# Patient Record
Sex: Female | Born: 1975 | Race: Black or African American | Hispanic: Yes | Marital: Married | Smoking: Never smoker
Health system: Southern US, Community
[De-identification: ages and names within clinical notes are randomized; demographics above are authoritative.]

## PROBLEM LIST (undated history)

## (undated) DIAGNOSIS — G43909 Migraine, unspecified, not intractable, without status migrainosus: Secondary | ICD-10-CM

## (undated) DIAGNOSIS — T7840XA Allergy, unspecified, initial encounter: Secondary | ICD-10-CM

## (undated) HISTORY — PX: NO PAST SURGERIES: SHX2092

## (undated) HISTORY — DX: Allergy, unspecified, initial encounter: T78.40XA

## (undated) HISTORY — DX: Migraine, unspecified, not intractable, without status migrainosus: G43.909

---

## 2020-05-15 ENCOUNTER — Ambulatory Visit (INDEPENDENT_AMBULATORY_CARE_PROVIDER_SITE_OTHER): Payer: BC Managed Care – PPO

## 2020-05-15 ENCOUNTER — Encounter: Payer: Self-pay | Admitting: Medical-Surgical

## 2020-05-15 ENCOUNTER — Ambulatory Visit (INDEPENDENT_AMBULATORY_CARE_PROVIDER_SITE_OTHER): Payer: BC Managed Care – PPO | Admitting: Medical-Surgical

## 2020-05-15 ENCOUNTER — Other Ambulatory Visit: Payer: Self-pay

## 2020-05-15 VITALS — BP 104/73 | HR 69 | Temp 98.0°F | Ht 63.0 in | Wt 231.1 lb

## 2020-05-15 DIAGNOSIS — M25551 Pain in right hip: Secondary | ICD-10-CM

## 2020-05-15 DIAGNOSIS — Z7689 Persons encountering health services in other specified circumstances: Secondary | ICD-10-CM

## 2020-05-15 DIAGNOSIS — Z114 Encounter for screening for human immunodeficiency virus [HIV]: Secondary | ICD-10-CM

## 2020-05-15 DIAGNOSIS — G4700 Insomnia, unspecified: Secondary | ICD-10-CM | POA: Diagnosis not present

## 2020-05-15 DIAGNOSIS — R6889 Other general symptoms and signs: Secondary | ICD-10-CM

## 2020-05-15 DIAGNOSIS — Z0000 Encounter for general adult medical examination without abnormal findings: Secondary | ICD-10-CM

## 2020-05-15 DIAGNOSIS — R29898 Other symptoms and signs involving the musculoskeletal system: Secondary | ICD-10-CM

## 2020-05-15 DIAGNOSIS — Z23 Encounter for immunization: Secondary | ICD-10-CM | POA: Diagnosis not present

## 2020-05-15 DIAGNOSIS — F418 Other specified anxiety disorders: Secondary | ICD-10-CM | POA: Insufficient documentation

## 2020-05-15 DIAGNOSIS — R631 Polydipsia: Secondary | ICD-10-CM

## 2020-05-15 DIAGNOSIS — R531 Weakness: Secondary | ICD-10-CM | POA: Diagnosis not present

## 2020-05-15 DIAGNOSIS — Z1159 Encounter for screening for other viral diseases: Secondary | ICD-10-CM

## 2020-05-15 NOTE — Patient Instructions (Signed)
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
## 2020-05-19 LAB — COMPLETE METABOLIC PANEL WITH GFR
AG Ratio: 1.7 (calc) (ref 1.0–2.5)
ALT: 22 U/L (ref 6–29)
AST: 16 U/L (ref 10–30)
Albumin: 4.3 g/dL (ref 3.6–5.1)
Alkaline phosphatase (APISO): 55 U/L (ref 31–125)
BUN: 11 mg/dL (ref 7–25)
CO2: 28 mmol/L (ref 20–32)
Calcium: 9.1 mg/dL (ref 8.6–10.2)
Chloride: 105 mmol/L (ref 98–110)
Creat: 0.62 mg/dL (ref 0.50–1.10)
GFR, Est African American: 128 mL/min/{1.73_m2} (ref >=60)
GFR, Est Non African American: 110 mL/min/{1.73_m2} (ref >=60)
Globulin: 2.5 g/dL (calc) (ref 1.9–3.7)
Glucose, Bld: 84 mg/dL (ref 65–139)
Potassium: 4 mmol/L (ref 3.5–5.3)
Sodium: 139 mmol/L (ref 135–146)
Total Bilirubin: 0.4 mg/dL (ref 0.2–1.2)
Total Protein: 6.8 g/dL (ref 6.1–8.1)

## 2020-05-19 LAB — CBC
HCT: 36.2 % (ref 35.0–45.0)
Hemoglobin: 11.6 g/dL — ABNORMAL LOW (ref 11.7–15.5)
MCH: 25.2 pg — ABNORMAL LOW (ref 27.0–33.0)
MCHC: 32 g/dL (ref 32.0–36.0)
MCV: 78.7 fL — ABNORMAL LOW (ref 80.0–100.0)
MPV: 11 fL (ref 7.5–12.5)
Platelets: 227 10*3/uL (ref 140–400)
RBC: 4.6 10*6/uL (ref 3.80–5.10)
RDW: 13.9 % (ref 11.0–15.0)
WBC: 5.6 10*3/uL (ref 3.8–10.8)

## 2020-05-19 LAB — TSH: TSH: 0.79 mIU/L

## 2020-05-19 LAB — HEMOGLOBIN A1C
Hgb A1c MFr Bld: 5.7 % of total Hgb — ABNORMAL HIGH (ref <5.7)
Mean Plasma Glucose: 117 (calc)
eAG (mmol/L): 6.5 (calc)

## 2020-05-19 LAB — PATHOLOGIST SMEAR REVIEW

## 2020-06-02 ENCOUNTER — Encounter: Payer: Self-pay | Admitting: Sports Medicine

## 2021-10-14 DIAGNOSIS — M9903 Segmental and somatic dysfunction of lumbar region: Secondary | ICD-10-CM | POA: Diagnosis not present

## 2021-10-14 DIAGNOSIS — M4716 Other spondylosis with myelopathy, lumbar region: Secondary | ICD-10-CM | POA: Diagnosis not present

## 2021-10-14 DIAGNOSIS — M9904 Segmental and somatic dysfunction of sacral region: Secondary | ICD-10-CM | POA: Diagnosis not present

## 2021-10-14 DIAGNOSIS — M5417 Radiculopathy, lumbosacral region: Secondary | ICD-10-CM | POA: Diagnosis not present

## 2021-10-26 ENCOUNTER — Telehealth: Payer: Self-pay

## 2021-10-26 NOTE — Telephone Encounter (Signed)
Patient left msg that she was returning a call. Please call after 230pm.

## 2021-10-28 NOTE — Telephone Encounter (Signed)
Called patient and unsure of reason of call. I checked her chart and no notes were there.

## 2021-10-30 DIAGNOSIS — M9904 Segmental and somatic dysfunction of sacral region: Secondary | ICD-10-CM | POA: Diagnosis not present

## 2021-10-30 DIAGNOSIS — M4716 Other spondylosis with myelopathy, lumbar region: Secondary | ICD-10-CM | POA: Diagnosis not present

## 2021-10-30 DIAGNOSIS — M9903 Segmental and somatic dysfunction of lumbar region: Secondary | ICD-10-CM | POA: Diagnosis not present

## 2021-10-30 DIAGNOSIS — M5417 Radiculopathy, lumbosacral region: Secondary | ICD-10-CM | POA: Diagnosis not present

## 2021-11-02 DIAGNOSIS — M5417 Radiculopathy, lumbosacral region: Secondary | ICD-10-CM | POA: Diagnosis not present

## 2021-11-02 DIAGNOSIS — M9904 Segmental and somatic dysfunction of sacral region: Secondary | ICD-10-CM | POA: Diagnosis not present

## 2021-11-02 DIAGNOSIS — M4716 Other spondylosis with myelopathy, lumbar region: Secondary | ICD-10-CM | POA: Diagnosis not present

## 2021-11-02 DIAGNOSIS — M9903 Segmental and somatic dysfunction of lumbar region: Secondary | ICD-10-CM | POA: Diagnosis not present

## 2021-11-04 DIAGNOSIS — M4716 Other spondylosis with myelopathy, lumbar region: Secondary | ICD-10-CM | POA: Diagnosis not present

## 2021-11-04 DIAGNOSIS — M9903 Segmental and somatic dysfunction of lumbar region: Secondary | ICD-10-CM | POA: Diagnosis not present

## 2021-11-04 DIAGNOSIS — M9904 Segmental and somatic dysfunction of sacral region: Secondary | ICD-10-CM | POA: Diagnosis not present

## 2021-11-04 DIAGNOSIS — M5417 Radiculopathy, lumbosacral region: Secondary | ICD-10-CM | POA: Diagnosis not present

## 2021-11-09 DIAGNOSIS — M5417 Radiculopathy, lumbosacral region: Secondary | ICD-10-CM | POA: Diagnosis not present

## 2021-11-09 DIAGNOSIS — M9903 Segmental and somatic dysfunction of lumbar region: Secondary | ICD-10-CM | POA: Diagnosis not present

## 2021-11-09 DIAGNOSIS — M4716 Other spondylosis with myelopathy, lumbar region: Secondary | ICD-10-CM | POA: Diagnosis not present

## 2021-11-09 DIAGNOSIS — M9904 Segmental and somatic dysfunction of sacral region: Secondary | ICD-10-CM | POA: Diagnosis not present

## 2021-11-11 DIAGNOSIS — M9904 Segmental and somatic dysfunction of sacral region: Secondary | ICD-10-CM | POA: Diagnosis not present

## 2021-11-11 DIAGNOSIS — M4716 Other spondylosis with myelopathy, lumbar region: Secondary | ICD-10-CM | POA: Diagnosis not present

## 2021-11-11 DIAGNOSIS — M5417 Radiculopathy, lumbosacral region: Secondary | ICD-10-CM | POA: Diagnosis not present

## 2021-11-11 DIAGNOSIS — M9903 Segmental and somatic dysfunction of lumbar region: Secondary | ICD-10-CM | POA: Diagnosis not present

## 2021-11-13 DIAGNOSIS — M4716 Other spondylosis with myelopathy, lumbar region: Secondary | ICD-10-CM | POA: Diagnosis not present

## 2021-11-13 DIAGNOSIS — M9904 Segmental and somatic dysfunction of sacral region: Secondary | ICD-10-CM | POA: Diagnosis not present

## 2021-11-13 DIAGNOSIS — M5417 Radiculopathy, lumbosacral region: Secondary | ICD-10-CM | POA: Diagnosis not present

## 2021-11-13 DIAGNOSIS — M9903 Segmental and somatic dysfunction of lumbar region: Secondary | ICD-10-CM | POA: Diagnosis not present

## 2021-11-18 DIAGNOSIS — M9904 Segmental and somatic dysfunction of sacral region: Secondary | ICD-10-CM | POA: Diagnosis not present

## 2021-11-18 DIAGNOSIS — M4716 Other spondylosis with myelopathy, lumbar region: Secondary | ICD-10-CM | POA: Diagnosis not present

## 2021-11-18 DIAGNOSIS — M5417 Radiculopathy, lumbosacral region: Secondary | ICD-10-CM | POA: Diagnosis not present

## 2021-11-18 DIAGNOSIS — M9903 Segmental and somatic dysfunction of lumbar region: Secondary | ICD-10-CM | POA: Diagnosis not present

## 2021-11-20 DIAGNOSIS — M9903 Segmental and somatic dysfunction of lumbar region: Secondary | ICD-10-CM | POA: Diagnosis not present

## 2021-11-20 DIAGNOSIS — M9904 Segmental and somatic dysfunction of sacral region: Secondary | ICD-10-CM | POA: Diagnosis not present

## 2021-11-20 DIAGNOSIS — M4716 Other spondylosis with myelopathy, lumbar region: Secondary | ICD-10-CM | POA: Diagnosis not present

## 2021-11-20 DIAGNOSIS — M5417 Radiculopathy, lumbosacral region: Secondary | ICD-10-CM | POA: Diagnosis not present

## 2021-11-23 DIAGNOSIS — M5417 Radiculopathy, lumbosacral region: Secondary | ICD-10-CM | POA: Diagnosis not present

## 2021-11-23 DIAGNOSIS — M9904 Segmental and somatic dysfunction of sacral region: Secondary | ICD-10-CM | POA: Diagnosis not present

## 2021-11-23 DIAGNOSIS — M4716 Other spondylosis with myelopathy, lumbar region: Secondary | ICD-10-CM | POA: Diagnosis not present

## 2021-11-23 DIAGNOSIS — M9903 Segmental and somatic dysfunction of lumbar region: Secondary | ICD-10-CM | POA: Diagnosis not present

## 2021-11-25 DIAGNOSIS — M9902 Segmental and somatic dysfunction of thoracic region: Secondary | ICD-10-CM | POA: Diagnosis not present

## 2021-11-25 DIAGNOSIS — M9903 Segmental and somatic dysfunction of lumbar region: Secondary | ICD-10-CM | POA: Diagnosis not present

## 2021-11-25 DIAGNOSIS — M9904 Segmental and somatic dysfunction of sacral region: Secondary | ICD-10-CM | POA: Diagnosis not present

## 2021-11-25 DIAGNOSIS — M5417 Radiculopathy, lumbosacral region: Secondary | ICD-10-CM | POA: Diagnosis not present

## 2021-11-25 DIAGNOSIS — M9901 Segmental and somatic dysfunction of cervical region: Secondary | ICD-10-CM | POA: Diagnosis not present

## 2021-11-25 DIAGNOSIS — M4716 Other spondylosis with myelopathy, lumbar region: Secondary | ICD-10-CM | POA: Diagnosis not present

## 2021-11-30 DIAGNOSIS — M4716 Other spondylosis with myelopathy, lumbar region: Secondary | ICD-10-CM | POA: Diagnosis not present

## 2021-11-30 DIAGNOSIS — M5417 Radiculopathy, lumbosacral region: Secondary | ICD-10-CM | POA: Diagnosis not present

## 2021-11-30 DIAGNOSIS — M9903 Segmental and somatic dysfunction of lumbar region: Secondary | ICD-10-CM | POA: Diagnosis not present

## 2021-11-30 DIAGNOSIS — M9904 Segmental and somatic dysfunction of sacral region: Secondary | ICD-10-CM | POA: Diagnosis not present

## 2022-01-01 ENCOUNTER — Ambulatory Visit (INDEPENDENT_AMBULATORY_CARE_PROVIDER_SITE_OTHER): Payer: BC Managed Care – PPO | Admitting: Medical-Surgical

## 2022-01-01 ENCOUNTER — Encounter: Payer: Self-pay | Admitting: Medical-Surgical

## 2022-01-01 VITALS — BP 128/88 | HR 68 | Resp 20 | Ht 63.0 in | Wt 215.7 lb

## 2022-01-01 DIAGNOSIS — Z1211 Encounter for screening for malignant neoplasm of colon: Secondary | ICD-10-CM | POA: Diagnosis not present

## 2022-01-01 DIAGNOSIS — Z0000 Encounter for general adult medical examination without abnormal findings: Secondary | ICD-10-CM

## 2022-01-01 DIAGNOSIS — R7303 Prediabetes: Secondary | ICD-10-CM

## 2022-01-01 DIAGNOSIS — Z0100 Encounter for examination of eyes and vision without abnormal findings: Secondary | ICD-10-CM

## 2022-01-01 DIAGNOSIS — Z1231 Encounter for screening mammogram for malignant neoplasm of breast: Secondary | ICD-10-CM

## 2022-01-01 DIAGNOSIS — Z1329 Encounter for screening for other suspected endocrine disorder: Secondary | ICD-10-CM | POA: Diagnosis not present

## 2022-01-01 NOTE — Progress Notes (Signed)
HPI: ?Lydia Wilkins is a 46 y.o. female who  has a past medical history of Allergy and Migraines. ? ?she presents to [facility] today, 01/01/22,  ?for chief complaint of: ?Annual physical exam ? ?Dentist: [facility], looking for a dentist ?Eye exam: Overdue, would like a referral ?Exercise: none intentional, busy all day at work ?Diet: no restrictions ?Pap smear: due, will do when she doesn't have her boys with her ?Mammogram: due ?Colon cancer screening: due ? ?Concerns: ?None ? ?Past medical, surgical, social and family history reviewed: ? ?Patient Active Problem List  ? Diagnosis Date Noted  ? Depression with anxiety 05/15/2020  ? Insomnia 05/15/2020  ? ? ?Past Surgical History:  ?Procedure Laterality Date  ? NO PAST SURGERIES    ? ? ?Social History  ? ?Tobacco Use  ? Smoking status: Never  ? Smokeless tobacco: Never  ?Substance Use Topics  ? Alcohol use: Not Currently  ? ? ?Family History  ?Problem Relation Age of Onset  ? Diabetes Father   ? Diabetes Paternal Grandmother   ? Heart disease Paternal Grandmother   ? Stroke Paternal Grandmother   ?  ? ?Current medication list and allergy/intolerance information reviewed:   ? ?No current outpatient medications on file.  ? ?No current facility-administered medications for this visit.  ? ? ?No Known Allergies ?  ? ?Review of Systems: ?Constitutional:  No  fever, no chills, No recent illness, No unintentional weight changes. + significant fatigue.  ?HEENT: No  headache, no vision change, no hearing change, No sore throat, No  sinus pressure ?Cardiac: No  chest pain, No  pressure, No palpitations, No  Orthopnea ?Respiratory:  No  shortness of breath. No  Cough ?Gastrointestinal: No  abdominal pain, No  nausea, No  vomiting,  No  blood in stool, No  diarrhea, No  constipation  ?Musculoskeletal: No new myalgia/arthralgia ?Skin: No  Rash, No other wounds/concerning lesions ?Genitourinary: No  incontinence, No  abnormal genital bleeding, No  abnormal genital discharge ?Hem/Onc: No  easy bruising/bleeding, No  abnormal lymph node ?Endocrine: No cold intolerance,  No heat intolerance. No polyuria/polydipsia/polyphagia  ?Neurologic: No  weakness, No  dizziness, No  slurred speech/focal weakness/facial droop ?Psychiatric: + concerns with depression, No  concerns with anxiety, No sleep problems, + mood problems ? ?Exam:  ?BP 128/88   Pulse 68   Resp 20   Ht 5\' 3"  (1.6 m)   Wt 215 lb 11.2 oz (97.8 kg)   SpO2 97%   BMI 38.21 kg/m?  ?Constitutional: VS see above. General Appearance: alert, well-developed, well-nourished, NAD ?Eyes: Normal lids and conjunctive, non-icteric sclera ?Ears, Nose, Mouth, Throat: MMM, Normal external inspection ears/nares/mouth/lips/gums. TM normal bilaterally. Pharynx/tonsils no erythema, no exudate. Nasal mucosa normal.  ?Neck: No masses, trachea midline. No thyroid enlargement. No tenderness/mass appreciated. No lymphadenopathy ?Respiratory: Normal respiratory effort. no wheeze, no rhonchi, no rales ?Cardiovascular: S1/S2 normal, no murmur, no rub/gallop auscultated. RRR. No lower extremity edema. Pedal pulse II/IV bilaterally PT. No carotid bruit or JVD. No abdominal aortic bruit. ?Gastrointestinal: Nontender, no masses. No hepatomegaly, no splenomegaly. No hernia appreciated. Bowel sounds normal. Rectal exam deferred.  ?Musculoskeletal: Gait normal. No clubbing/cyanosis of digits.  ?Neurological: Normal balance/coordination. No tremor. No cranial nerve deficit on limited exam. Motor and sensation intact and symmetric. Cerebellar reflexes intact.  ?Skin: warm, dry, intact. No rash/ulcer. No concerning nevi or subq nodules on limited exam.   ?Psychiatric: Normal judgment/insight. Normal mood and affect. Oriented x3.  ? ? ?ASSESSMENT/PLAN:  ? ?  1. Annual physical exam ?Checking labs. Referring to ophthalmology for updated eye exam. Recommend calling insurance to see who is in network for dentistry. Wellness information provided  with AVS. Advised to schedule pap smear at her convenience.  ?- CBC with Differential/Platelet ?- COMPLETE METABOLIC PANEL WITH GFR ?- Lipid panel ? ?2. Prediabetes ?Checking A1c.  ?- Hemoglobin A1c ? ?3. Thyroid disorder screen ?Checking TSH.  ?- TSH ? ?4. Colon cancer screening ?Referring to GI. ?- Ambulatory referral to Gastroenterology ? ?5. Eye exam, routine ?Referring to ophthalmology ?- Ambulatory referral to Ophthalmology ? ?6. Encounter for screening mammogram for malignant neoplasm of breast ?Mammogram ordered.  ?- MM 3D SCREEN BREAST BILATERAL; Future ? ? ?Orders Placed This Encounter  ?Procedures  ? MM 3D SCREEN BREAST BILATERAL  ? CBC with Differential/Platelet  ? COMPLETE METABOLIC PANEL WITH GFR  ? Lipid panel  ? TSH  ? Hemoglobin A1c  ? Ambulatory referral to Gastroenterology  ? Ambulatory referral to Ophthalmology  ? ? ?No orders of the defined types were placed in this encounter. ? ? ?There are no Patient Instructions on file for this visit. ? ?Follow-up plan: Return for pap smear at your convenience. ? ?Thayer Ohm, DNP, APRN, FNP-BC ?[facility] ?Primary Care and Sports Medicine ?

## 2022-01-02 LAB — CBC WITH DIFFERENTIAL/PLATELET
Absolute Monocytes: 474 cells/uL (ref 200–950)
Basophils Absolute: 18 cells/uL (ref 0–200)
Basophils Relative: 0.3 %
Eosinophils Absolute: 78 cells/uL (ref 15–500)
Eosinophils Relative: 1.3 %
HCT: 38.3 % (ref 35.0–45.0)
Hemoglobin: 12.5 g/dL (ref 11.7–15.5)
Lymphs Abs: 2712 cells/uL (ref 850–3900)
MCH: 25.8 pg — ABNORMAL LOW (ref 27.0–33.0)
MCHC: 32.6 g/dL (ref 32.0–36.0)
MCV: 79.1 fL — ABNORMAL LOW (ref 80.0–100.0)
MPV: 11.5 fL (ref 7.5–12.5)
Monocytes Relative: 7.9 %
Neutro Abs: 2718 cells/uL (ref 1500–7800)
Neutrophils Relative %: 45.3 %
Platelets: 265 10*3/uL (ref 140–400)
RBC: 4.84 10*6/uL (ref 3.80–5.10)
RDW: 13.7 % (ref 11.0–15.0)
Total Lymphocyte: 45.2 %
WBC: 6 10*3/uL (ref 3.8–10.8)

## 2022-01-02 LAB — LIPID PANEL
Cholesterol: 179 mg/dL (ref <200)
HDL: 41 mg/dL — ABNORMAL LOW (ref >=50)
LDL Cholesterol (Calc): 117 mg/dL (calc) — ABNORMAL HIGH
Non-HDL Cholesterol (Calc): 138 mg/dL (calc) — ABNORMAL HIGH (ref <130)
Total CHOL/HDL Ratio: 4.4 (calc) (ref <5.0)
Triglycerides: 99 mg/dL (ref <150)

## 2022-01-02 LAB — COMPLETE METABOLIC PANEL WITH GFR
AG Ratio: 1.7 (calc) (ref 1.0–2.5)
ALT: 25 U/L (ref 6–29)
AST: 16 U/L (ref 10–35)
Albumin: 4.6 g/dL (ref 3.6–5.1)
Alkaline phosphatase (APISO): 65 U/L (ref 31–125)
BUN: 11 mg/dL (ref 7–25)
CO2: 26 mmol/L (ref 20–32)
Calcium: 9.2 mg/dL (ref 8.6–10.2)
Chloride: 104 mmol/L (ref 98–110)
Creat: 0.55 mg/dL (ref 0.50–0.99)
Globulin: 2.7 g/dL (calc) (ref 1.9–3.7)
Glucose, Bld: 83 mg/dL (ref 65–139)
Potassium: 4.1 mmol/L (ref 3.5–5.3)
Sodium: 139 mmol/L (ref 135–146)
Total Bilirubin: 0.5 mg/dL (ref 0.2–1.2)
Total Protein: 7.3 g/dL (ref 6.1–8.1)
eGFR: 115 mL/min/{1.73_m2} (ref >=60)

## 2022-01-02 LAB — HEMOGLOBIN A1C
Hgb A1c MFr Bld: 5.6 % of total Hgb (ref <5.7)
Mean Plasma Glucose: 114 mg/dL
eAG (mmol/L): 6.3 mmol/L

## 2022-01-02 LAB — TSH: TSH: 0.75 mIU/L

## 2022-01-28 ENCOUNTER — Ambulatory Visit: Payer: BC Managed Care – PPO

## 2022-02-04 ENCOUNTER — Ambulatory Visit (INDEPENDENT_AMBULATORY_CARE_PROVIDER_SITE_OTHER): Payer: BC Managed Care – PPO

## 2022-02-04 DIAGNOSIS — Z1231 Encounter for screening mammogram for malignant neoplasm of breast: Secondary | ICD-10-CM | POA: Diagnosis not present

## 2022-02-09 ENCOUNTER — Encounter: Payer: Self-pay | Admitting: Gastroenterology

## 2022-02-09 ENCOUNTER — Other Ambulatory Visit: Payer: Self-pay | Admitting: Medical-Surgical

## 2022-02-09 DIAGNOSIS — R928 Other abnormal and inconclusive findings on diagnostic imaging of breast: Secondary | ICD-10-CM

## 2022-02-13 ENCOUNTER — Ambulatory Visit: Payer: BC Managed Care – PPO

## 2022-02-13 ENCOUNTER — Ambulatory Visit
Admission: RE | Admit: 2022-02-13 | Discharge: 2022-02-13 | Disposition: A | Discharge status: Home / Self Care | Payer: BC Managed Care – PPO | Source: Ambulatory Visit | Attending: Medical-Surgical | Admitting: Medical-Surgical

## 2022-02-13 DIAGNOSIS — R928 Other abnormal and inconclusive findings on diagnostic imaging of breast: Secondary | ICD-10-CM

## 2022-02-13 DIAGNOSIS — N6489 Other specified disorders of breast: Secondary | ICD-10-CM | POA: Diagnosis not present

## 2022-03-25 ENCOUNTER — Encounter: Payer: BC Managed Care – PPO | Admitting: Obstetrics and Gynecology

## 2022-04-08 ENCOUNTER — Encounter: Payer: BC Managed Care – PPO | Admitting: Gastroenterology

## 2022-07-21 ENCOUNTER — Telehealth: Payer: Self-pay | Admitting: *Deleted

## 2022-07-21 NOTE — Telephone Encounter (Signed)
Returned call from 07/20/2022 at 2:42 PM. Left patient a message to call and reschedule appointment from 03/25/2022.

## 2022-08-10 ENCOUNTER — Ambulatory Visit
Admission: EM | Admit: 2022-08-10 | Discharge: 2022-08-10 | Disposition: A | Discharge status: Home / Self Care | Payer: 59 | Attending: Family Medicine | Admitting: Family Medicine

## 2022-08-10 ENCOUNTER — Encounter: Payer: Self-pay | Admitting: Gastroenterology

## 2022-08-10 DIAGNOSIS — R6889 Other general symptoms and signs: Secondary | ICD-10-CM

## 2022-08-10 DIAGNOSIS — Z20818 Contact with and (suspected) exposure to other bacterial communicable diseases: Secondary | ICD-10-CM | POA: Diagnosis not present

## 2022-08-10 MED ORDER — AMOXICILLIN 875 MG PO TABS: 875.0000 mg | ORAL_TABLET | Freq: Two times a day (BID) | ORAL | 0 refills | Status: AC

## 2022-08-10 NOTE — ED Provider Notes (Signed)
L[facility] Wil[facility]    CSN: [id number] Arrival date & time: 08/10/22  1108      History   Chief Complaint Chief Complaint  Patient presents with   Chills    Chills, nasal congestion, headache, dizziness, facial pain and chest tightness. X5 days    HPI Lydia Wilkins is a 46 y.o. female.   HPI\  States she has some sinus congestion and postnasal drip.  Sore throat.  She is feeling somewhat dizzy.  Headache and body aches.  She has been sick for 5 days.  Her son started with a fever yesterday.  He is here with her today and he tested positive for strep throat.  Past Medical History:  Diagnosis Date   Allergy    Migraines     Patient Active Problem List   Diagnosis Date Noted   Depression with anxiety 05/15/2020   Insomnia 05/15/2020    Past Surgical History:  Procedure Laterality Date   NO PAST SURGERIES      OB History   No obstetric history on file.      Home Medications    Prior to Admission medications   Medication Sig Start Date End Date Taking? Authorizing Provider  amoxicillin (AMOXIL) 875 MG tablet Take 1 tablet (875 mg total) by mouth 2 (two) times daily for 10 days. 08/10/22 08/20/22 Yes Raylene Everts, MD  Multiple Vitamin (MULTIVITAMIN) capsule Take 1 capsule by mouth daily.   Yes [provider]    Family History Family History  Problem Relation Age of Onset   Diabetes Father    Diabetes Paternal Grandmother    Heart disease Paternal Grandmother    Stroke Paternal Grandmother     Social History Social History   Tobacco Use   Smoking status: Never   Smokeless tobacco: Never  Vaping Use   Vaping Use: Never used  Substance Use Topics   Alcohol use: Not Currently   Drug use: Never     Allergies   Patient has no known allergies.   Review of Systems Review of Systems  See HPI Physical Exam Triage Vital Signs ED Triage Vitals  Enc Vitals Group     BP 08/10/22 1131 126/84     Pulse Rate 08/10/22 1131 65      Resp 08/10/22 1131 18     Temp 08/10/22 1131 98.6 F (37 C)     Temp Source 08/10/22 1131 Oral     SpO2 08/10/22 1131 97 %     Weight 08/10/22 1128 220 lb (99.8 kg)     Height 08/10/22 1128 5\' 2"  (1.575 m)     Head Circumference --      Peak Flow --      Pain Score 08/10/22 1127 7     Pain Loc --      Pain Edu? --      Excl. in [place]? --    No data found.  Updated Vital Signs BP 126/84 (BP Location: Right Arm)   Pulse 65   Temp 98.6 F (37 C) (Oral)   Resp 18   Ht 5\' 2"  (1.575 m)   Wt 99.8 kg   SpO2 97%   BMI 40.24 kg/m      Physical Exam Constitutional:      General: She is not in acute distress.    Appearance: She is well-developed. She is ill-appearing.  HENT:     Head: Normocephalic and atraumatic.     Right Ear: Tympanic membrane and ear canal  normal.     Left Ear: Tympanic membrane and ear canal normal.     Nose: Rhinorrhea present.     Mouth/Throat:     Pharynx: Posterior oropharyngeal erythema present.  Eyes:     Conjunctiva/sclera: Conjunctivae normal.     Pupils: Pupils are equal, round, and reactive to light.  Cardiovascular:     Rate and Rhythm: Normal rate.  Pulmonary:     Effort: Pulmonary effort is normal. No respiratory distress.  Abdominal:     General: There is no distension.     Palpations: Abdomen is soft.  Musculoskeletal:        General: Normal range of motion.     Cervical back: Normal range of motion.  Lymphadenopathy:     Cervical: Cervical adenopathy present.  Skin:    General: Skin is warm and dry.  Neurological:     Mental Status: She is alert.      UC Treatments / Results  Labs (all labs ordered are listed, but only abnormal results are displayed) Labs Reviewed - No data to display  EKG   Radiology No results found.  Procedures Procedures (including critical care time)  Medications Ordered in UC Medications - No data to display  Initial Impression / Assessment and Plan / UC Course  I have reviewed the triage  vital signs and the nursing notes.  Pertinent labs & imaging results that were available during my care of the patient were reviewed by me and considered in my medical decision making (see chart for details).     Final Clinical Impressions(s) / UC Diagnoses   Final diagnoses:  Flu-like symptoms  Strep throat exposure     Discharge Instructions      Take over-the-counter cough and cold medicines as needed Drink lots of fluids Take the amoxicillin twice a day for 10 full days   ED Prescriptions     Medication Sig Dispense Auth. Provider   amoxicillin (AMOXIL) 875 MG tablet Take 1 tablet (875 mg total) by mouth 2 (two) times daily for 10 days. 20 tablet Eustace Moore, MD      PDMP not reviewed this encounter.   Eustace Moore, MD 08/10/22 [phone]

## 2022-08-10 NOTE — Discharge Instructions (Signed)
Take over-the-counter cough and cold medicines as needed Drink lots of fluids Take the amoxicillin twice a day for 10 full days

## 2022-08-10 NOTE — ED Triage Notes (Signed)
Pt states that she has some chills, nasal congestion, headache, dizziness, facial pain and chest tightness. X5 days

## 2022-08-19 ENCOUNTER — Other Ambulatory Visit (HOSPITAL_COMMUNITY)
Admission: RE | Admit: 2022-08-19 | Discharge: 2022-08-19 | Disposition: A | Discharge status: Home / Self Care | Payer: 59 | Source: Ambulatory Visit | Attending: Obstetrics and Gynecology | Admitting: Obstetrics and Gynecology

## 2022-08-19 ENCOUNTER — Ambulatory Visit (INDEPENDENT_AMBULATORY_CARE_PROVIDER_SITE_OTHER): Payer: 59 | Admitting: Obstetrics and Gynecology

## 2022-08-19 ENCOUNTER — Encounter: Payer: Self-pay | Admitting: Obstetrics and Gynecology

## 2022-08-19 VITALS — BP 125/83 | HR 76 | Ht 62.0 in | Wt 212.0 lb

## 2022-08-19 DIAGNOSIS — Z01419 Encounter for gynecological examination (general) (routine) without abnormal findings: Secondary | ICD-10-CM

## 2022-08-19 DIAGNOSIS — N939 Abnormal uterine and vaginal bleeding, unspecified: Secondary | ICD-10-CM | POA: Diagnosis not present

## 2022-08-19 DIAGNOSIS — N814 Uterovaginal prolapse, unspecified: Secondary | ICD-10-CM | POA: Diagnosis not present

## 2022-08-19 DIAGNOSIS — N841 Polyp of cervix uteri: Secondary | ICD-10-CM

## 2022-08-19 LAB — POCT URINE PREGNANCY: Preg Test, Ur: NEGATIVE

## 2022-08-19 NOTE — Progress Notes (Signed)
ANNUAL EXAM Patient name: Lydia Wilkins MRN [id number]  Date of birth: 1975-12-02 Chief Complaint:   Annual exam  History of Present Illness:   Juhi Lagrange is a 46 y.o. [id number] being seen today for a routine annual exam.  Current complaints: AUB and concern for prolapse  AUB - 2 years of irregular, heavy bleeding; getting worse. Periods q1-73mo, will bleed through clothes and pads q34min at worst. Assoc intermenstrual spotting. No hot flashes or hirsutism  Prolapse - feels something at introitus  The pregnancy intention screening data noted above was reviewed. Potential methods of contraception were discussed. The patient declines.  Last pap none in our system Last mammogram: 02/2022. Results were: normal. Family h/o breast cancer: no Last colonoscopy: never. Family h/o colorectal cancer: no     01/01/2022    4:52 PM 05/15/2020   10:50 AM  Depression screen PHQ 2/9  Decreased Interest 1 1  Down, Depressed, Hopeless 1 0  PHQ - 2 Score 2 1  Altered sleeping 2 3  Tired, decreased energy 3 1  Change in appetite 3 3  Feeling bad or failure about yourself  1 0  Trouble concentrating 1 0  Moving slowly or fidgety/restless 1 0  Suicidal thoughts 1 0  PHQ-9 Score 14 8  Difficult doing work/chores Somewhat difficult Somewhat difficult       01/01/2022    4:52 PM 05/15/2020   10:53 AM  GAD 7 : Generalized Anxiety Score  Nervous, Anxious, on Edge 2 1  Control/stop worrying 1 0  Worry too much - different things 1 0  Trouble relaxing 2   Restless 3 0  Easily annoyed or irritable 3 1  Afraid - awful might happen 0 0  Total GAD 7 Score 12   Anxiety Difficulty Somewhat difficult Somewhat difficult   Review of Systems:   Pertinent items are noted in HPI Denies any headaches, blurred vision, fatigue, shortness of breath, chest pain, abdominal pain, abnormal vaginal discharge/itching/odor/irritation, bowel movements, urination, or intercourse unless otherwise stated above. Pertinent  History Reviewed:  Reviewed past medical,surgical, social and family history.  Reviewed problem list, medications and allergies. Physical Assessment:   Vitals:   08/19/22 1457  BP: 125/83  Pulse: 76  Weight: 212 lb (96.2 kg)  Height: 5\' 2"  (1.575 m)  Body mass index is 38.78 kg/m.        Physical Examination:   General appearance - well appearing, and in no distress  Mental status - alert, oriented to person, place, and time  Psych:  She has a normal mood and affect  Chest - effort normal, all lung fields clear to auscultation bilaterally  Heart - normal rate and regular rhythm  Breasts - deferred  Abdomen - soft, nontender, nondistended, no masses or organomegaly  Pelvic - VULVA: normal appearing vulva with no masses, tenderness or lesions  VAGINA: normal appearing vagina with normal color and discharge, no lesions. +anterior compartment prolapse.   CERVIX: cervical polyps noted, otherwise normal appearing cervix without discharge, no CMT  Thin prep pap is done with HR HPV cotesting  UTERUS: uterus is felt to be normal size, shape, consistency and nontender   ADNEXA: No adnexal masses or tenderness noted.  Chaperone present for exam  Results for orders placed or performed in visit on 08/19/22 (from the past 24 hour(s))  POCT urine pregnancy   Collection Time: 08/19/22  3:23 PM  Result Value Ref Range   Preg Test, Ur Negative Negative    Assessment &  Plan:  1) Well-Woman Exam Pap collected Mammogram: in 1 year, or sooner if problems Colonoscopy: scheduled 09/24/22  2) AUB, worsening We reviewed a focused differential diagnosis for AUB that includes, but is not limited to anovulatory cycles (menopausal transition/POI, PCOS, hypothalamic hypogonadism), endometrial/endocervical polyps, fibroids, endometrial hyperplasia/endometrial cancer, and thyroid disease. Given her history & exam, the most likely etiology is menopausal transition and cervical polyps. UPT negative TSH  normal 12/2021 PRL, FSH & pelvic US ordered Given her age >/= 110 with heavy bleeding, intermenstrual spotting, and BMI 38, EMB was recommended. She will return for EMB  3) Cervical polyps Will return for removal with EMB  4) Uterovaginal prolapse To be addressed at follow up appointment  Orders Placed This Encounter  Procedures   MM Digital Screening   US PELVIC COMPLETE WITH TRANSVAGINAL   Prolactin   FSH   LH   Estradiol   POCT urine pregnancy   Follow-up: Return in about 4 weeks (around 09/16/2022) for results review, endometrial biopsy and polyp removal.  Lennart Pall, MD 08/19/2022 3:38 PM

## 2022-08-20 ENCOUNTER — Telehealth: Payer: Self-pay | Admitting: *Deleted

## 2022-08-20 LAB — FOLLICLE STIMULATING HORMONE: FSH: 8.3 m[IU]/mL

## 2022-08-20 LAB — PROLACTIN: Prolactin: 11 ng/mL

## 2022-08-20 LAB — LUTEINIZING HORMONE: LH: 5.8 m[IU]/mL

## 2022-08-20 LAB — ESTRADIOL: Estradiol: 184 pg/mL

## 2022-08-20 NOTE — Telephone Encounter (Signed)
Left patient a message to call and schedule the following per Dr. Berton Lan. Return in about 4 weeks (around 09/16/2022) for results review, endometrial biopsy and polyp removal.

## 2022-08-24 ENCOUNTER — Ambulatory Visit (INDEPENDENT_AMBULATORY_CARE_PROVIDER_SITE_OTHER): Payer: 59

## 2022-08-24 DIAGNOSIS — N939 Abnormal uterine and vaginal bleeding, unspecified: Secondary | ICD-10-CM | POA: Diagnosis not present

## 2022-08-24 LAB — CYTOLOGY - PAP
Comment: NEGATIVE
Diagnosis: NEGATIVE
High risk HPV: NEGATIVE

## 2022-08-27 ENCOUNTER — Ambulatory Visit (AMBULATORY_SURGERY_CENTER): Payer: 59 | Admitting: *Deleted

## 2022-08-27 VITALS — Ht 63.0 in | Wt 212.0 lb

## 2022-08-27 DIAGNOSIS — Z1211 Encounter for screening for malignant neoplasm of colon: Secondary | ICD-10-CM

## 2022-08-27 MED ORDER — NA SULFATE-K SULFATE-MG SULF 17.5-3.13-1.6 GM/177ML PO SOLN: 1.0000 | Freq: Once | ORAL | 0 refills | Status: AC

## 2022-08-27 NOTE — Progress Notes (Signed)
No egg or soy allergy known to patient  No issues known to pt with past sedation with any surgeries or procedures Patient denies ever being told they had issues or difficulty with intubation  No issues moving her neck or swallowing No FH of Malignant Hyperthermia Pt is not on diet pills Pt is not on  home 02  Pt is not on blood thinners  Pt denies issues with constipation  Pt is not on dialysis Pt denies any upcoming cardiac testing Pt encouraged to use to use Singlecare or Goodrx to reduce cost  Patient's chart reviewed by Cathlyn Parsons CNRA prior to previsit and patient appropriate for the [facility].  Previsit completed and red dot placed by patient's name on their procedure day (on provider's schedule).  . Pre-visit on phone Instructions sent via my chart and mail with coupon

## 2022-09-20 ENCOUNTER — Encounter: Payer: Self-pay | Admitting: Gastroenterology

## 2022-09-23 ENCOUNTER — Other Ambulatory Visit: Payer: 59 | Admitting: Obstetrics and Gynecology

## 2022-09-24 ENCOUNTER — Ambulatory Visit (AMBULATORY_SURGERY_CENTER): Payer: BC Managed Care – PPO | Admitting: Gastroenterology

## 2022-09-24 ENCOUNTER — Encounter: Payer: Self-pay | Admitting: Gastroenterology

## 2022-09-24 VITALS — BP 135/84 | HR 81 | Temp 97.7°F | Resp 16 | Wt 212.0 lb

## 2022-09-24 DIAGNOSIS — Z1211 Encounter for screening for malignant neoplasm of colon: Secondary | ICD-10-CM

## 2022-09-24 HISTORY — PX: COLONOSCOPY: SHX174

## 2022-09-24 LAB — POCT URINE PREGNANCY: Preg Test, Ur: NEGATIVE

## 2022-09-24 MED ORDER — SODIUM CHLORIDE 0.9 % IV SOLN: 500.0000 mL | Freq: Once | INTRAVENOUS | Status: DC

## 2022-09-24 NOTE — Op Note (Signed)
[facility] Patient Name: Lydia Wilkins Procedure Date: 09/24/2022 3:55 PM MRN: [id number] Endoscopist: Napoleon Form , MD, [id number] Age: 46 Referring MD:  Date of Birth: 06-18-1976 Gender: Female Account #: [id number] Procedure:                Colonoscopy Indications:              Screening for colorectal malignant neoplasm Medicines:                Monitored Anesthesia Care Procedure:                Pre-Anesthesia Assessment:                           - Prior to the procedure, a History and Physical                            was performed, and patient medications and                            allergies were reviewed. The patient's tolerance of                            previous anesthesia was also reviewed. The risks                            and benefits of the procedure and the sedation                            options and risks were discussed with the patient.                            All questions were answered, and informed consent                            was obtained. Prior Anticoagulants: The patient has                            taken no anticoagulant or antiplatelet agents. ASA                            Grade Assessment: II - A patient with mild systemic                            disease. After reviewing the risks and benefits,                            the patient was deemed in satisfactory condition to                            undergo the procedure.                           After obtaining informed consent, the colonoscope  was passed under direct vision. Throughout the                            procedure, the patient's blood pressure, pulse, and                            oxygen saturations were monitored continuously. The                            Olympus Scope [id number] was introduced through the                            anus and advanced to the the cecum, identified by                             appendiceal orifice and ileocecal valve. The                            colonoscopy was performed without difficulty. The                            patient tolerated the procedure well. The quality                            of the bowel preparation was good. The ileocecal                            valve, appendiceal orifice, and rectum were                            photographed. Scope In: 4:06:09 PM Scope Out: 1:60:73 PM Scope Withdrawal Time: 0 hours 8 minutes 59 seconds  Total Procedure Duration: 0 hours 10 minutes 33 seconds  Findings:                 The perianal and digital rectal examinations were                            normal.                           Non-bleeding external and internal hemorrhoids were                            found during retroflexion. The hemorrhoids were                            medium-sized.                           The exam was otherwise without abnormality. Complications:            No immediate complications. Estimated Blood Loss:     Estimated blood loss: none. Impression:               - Non-bleeding external and internal hemorrhoids.                           -  The examination was otherwise normal.                           - No specimens collected. Recommendation:           - Resume previous diet.                           - Continue present medications.                           - Repeat colonoscopy in 10 years for surveillance. Mauri Pole, MD 09/24/2022 4:19:53 PM This report has been signed electronically.

## 2022-09-24 NOTE — Patient Instructions (Addendum)
Handout on hemorrhoids and hemorrhoidal banding given to patient. Resume previous diet and continue present medications. Call office to schedule hemorrhoidal banding Repeat colonoscopy in 10 years for surveillance!   YOU HAD AN ENDOSCOPIC PROCEDURE TODAY AT [facility]:   Refer to the procedure report that was given to you for any specific questions about what was found during the examination.  If the procedure report does not answer your questions, please call your gastroenterologist to clarify.  If you requested that your care partner not be given the details of your procedure findings, then the procedure report has been included in a sealed envelope for you to review at your convenience later.  YOU SHOULD EXPECT: Some feelings of bloating in the abdomen. Passage of more gas than usual.  Walking can help get rid of the air that was put into your GI tract during the procedure and reduce the bloating. If you had a lower endoscopy (such as a colonoscopy or flexible sigmoidoscopy) you may notice spotting of blood in your stool or on the toilet paper. If you underwent a bowel prep for your procedure, you may not have a normal bowel movement for a few days.  Please Note:  You might notice some irritation and congestion in your nose or some drainage.  This is from the oxygen used during your procedure.  There is no need for concern and it should clear up in a day or so.  SYMPTOMS TO REPORT IMMEDIATELY:  Following lower endoscopy (colonoscopy or flexible sigmoidoscopy):  Excessive amounts of blood in the stool  Significant tenderness or worsening of abdominal pains  Swelling of the abdomen that is new, acute  Fever of 100F or higher  For urgent or emergent issues, a gastroenterologist can be reached at any hour by calling [phone]. Do not use MyChart messaging for urgent concerns.    DIET:  We do recommend a small meal at first, but then you may proceed to your regular diet.   Drink plenty of fluids but you should avoid alcoholic beverages for 24 hours.  ACTIVITY:  You should plan to take it easy for the rest of today and you should NOT DRIVE or use heavy machinery until tomorrow (because of the sedation medicines used during the test).    FOLLOW UP: Our staff will call the number listed on your records the next business day following your procedure.  We will call around 7:15- 8:00 am to check on you and address any questions or concerns that you may have regarding the information given to you following your procedure. If we do not reach you, we will leave a message.     If any biopsies were taken you will be contacted by phone or by letter within the next 1-3 weeks.  Please call us at [phone] if you have not heard about the biopsies in 3 weeks.    SIGNATURES/CONFIDENTIALITY: You and/or your care partner have signed paperwork which will be entered into your electronic medical record.  These signatures attest to the fact that that the information above on your After Visit Summary has been reviewed and is understood.  Full responsibility of the confidentiality of this discharge information lies with you and/or your care-partner.

## 2022-09-24 NOTE — Progress Notes (Signed)
Pt's states no medical or surgical changes since previsit or office visit. 

## 2022-09-24 NOTE — Progress Notes (Signed)
[facility] History and Physical   Primary Care Physician:  Samuel Bouche, NP   Reason for Procedure:  Colorectal cancer screening  Plan:    Screening colonoscopy with possible interventions as needed     HPI: Lydia Wilkins is a very pleasant 47 y.o. female here for screening colonoscopy. Denies any nausea, vomiting, abdominal pain, melena or bright red blood per rectum  The risks and benefits as well as alternatives of endoscopic procedure(s) have been discussed and reviewed. All questions answered. The patient agrees to proceed.    Past Medical History:  Diagnosis Date   Allergy    Migraines     Past Surgical History:  Procedure Laterality Date   COLONOSCOPY  09/24/2022   NO PAST SURGERIES      Prior to Admission medications   Medication Sig Start Date End Date Taking? Authorizing Provider  Multiple Vitamin (MULTIVITAMIN) capsule Take 1 capsule by mouth daily.    [provider]    Current Outpatient Medications  Medication Sig Dispense Refill   Multiple Vitamin (MULTIVITAMIN) capsule Take 1 capsule by mouth daily.     Current Facility-Administered Medications  Medication Dose Route Frequency Provider Last Rate Last Admin   0.9 %  sodium chloride infusion  500 mL Intravenous Once Mauri Pole, MD        Allergies as of 09/24/2022   (No Known Allergies)    Family History  Problem Relation Age of Onset   Diabetes Father    Diabetes Paternal Grandmother    Heart disease Paternal Grandmother    Stroke Paternal Grandmother    Colon cancer Neg Hx    Colon polyps Neg Hx    Esophageal cancer Neg Hx    Rectal cancer Neg Hx    Stomach cancer Neg Hx     Social History   Socioeconomic History   Marital status: Married    Spouse name: Not on file   Number of children: Not on file   Years of education: Not on file   Highest education level: Not on file  Occupational History   Not on file  Tobacco Use   Smoking status: Never    Smokeless tobacco: Never  Vaping Use   Vaping Use: Never used  Substance and Sexual Activity   Alcohol use: Not Currently   Drug use: Never   Sexual activity: Yes    Partners: Male    Birth control/protection: None  Other Topics Concern   Not on file  Social History Narrative   Not on file   Social Determinants of Health   Financial Resource Strain: Not on file  Food Insecurity: Not on file  Transportation Needs: Not on file  Physical Activity: Not on file  Stress: Not on file  Social Connections: Not on file  Intimate Partner Violence: Not on file    Review of Systems:  All other review of systems negative except as mentioned in the HPI.  Physical Exam: Vital signs in last 24 hours: Blood Pressure 126/68   Pulse 74   Temperature 97.7 F (36.5 C) (Temporal)   Weight 212 lb (96.2 kg)   Last Menstrual Period 09/04/2022 (Exact Date)   Oxygen Saturation 98%   Body Mass Index 37.55 kg/m  General:   Alert, NAD Lungs:  Clear .   Heart:  Regular rate and rhythm Abdomen:  Soft, nontender and nondistended. Neuro/Psych:  Alert and cooperative. Normal mood and affect. A and O x 3  Reviewed labs, radiology imaging, old records  and pertinent past GI work up  Patient is appropriate for planned procedure(s) and anesthesia in an ambulatory setting   K. Denzil Magnuson , MD [phone]

## 2022-09-27 ENCOUNTER — Encounter: Payer: Self-pay | Admitting: Gastroenterology

## 2022-09-27 ENCOUNTER — Telehealth: Payer: Self-pay

## 2022-09-27 NOTE — Progress Notes (Unsigned)
GYNECOLOGY OFFICE VISIT NOTE  History:   Lydia Wilkins is a 47 y.o. [phone] here today for follow up from 12/14 when she had an annual. Also mentioned prolapse and AUB at that appointment. Noted to have cervical polyps at that time.   She had a pap with HPV which was normal/neg. She had a TVUS which showed a submucosal mass, possibly a fibroid measuring 16 mm in largest dimension. EL was 20 mm thick. I reviewed the images myself and it is well circumscribed mass and is most consistent with a fibroid. The majority of the fibroid is within the wall of the uterus with limited communication with the EL.       Past Medical History:  Diagnosis Date   Allergy    Migraines     Past Surgical History:  Procedure Laterality Date   COLONOSCOPY  09/24/2022   NO PAST SURGERIES      The following portions of the patient's history were reviewed and updated as appropriate: allergies, current medications, past family history, past medical history, past social history, past surgical history and problem list.   Health Maintenance:   Normal pap and negative HRHPV on ***.   Diagnosis  Date Value Ref Range Status  08/19/2022   Final   - Negative for intraepithelial lesion or malignancy (NILM)     Normal mammogram on 02/13/2022.   Review of Systems:  Pertinent items noted in HPI and remainder of comprehensive ROS otherwise negative.  Physical Exam:  LMP 09/04/2022 (Exact Date)  CONSTITUTIONAL: Well-developed, well-nourished female in no acute distress.  HEENT:  Normocephalic, atraumatic. External right and left ear normal. No scleral icterus.  NECK: Normal range of motion, supple, no masses noted on observation SKIN: No rash noted. Not diaphoretic. No erythema. No pallor. MUSCULOSKELETAL: Normal range of motion. No edema noted. NEUROLOGIC: Alert and oriented to person, place, and time. Normal muscle tone coordination. No cranial nerve deficit noted. PSYCHIATRIC: Normal mood and affect. Normal  behavior. Normal judgment and thought content.  CARDIOVASCULAR: Normal heart rate noted RESPIRATORY: Effort and breath sounds normal, no problems with respiration noted ABDOMEN: No masses noted. No other overt distention noted.    PELVIC: {Blank single:19197::"Deferred","Normal appearing external genitalia; normal urethral meatus; normal appearing vaginal mucosa and cervix.  No abnormal discharge noted.  Normal uterine size, no other palpable masses, no uterine or adnexal tenderness. Performed in the presence of a chaperone"}    ENDOMETRIAL BIOPSY PROCEDURE NOTE The indications for endometrial biopsy were reviewed.   Risks of the biopsy including cramping, bleeding, infection, uterine perforation, inadequate specimen and need for additional procedures were discussed. Offered alternative of hysteroscopy, dilation and curettage in OR. The patient states she understands the R/B/I/A and agrees to undergo procedure today. Urine pregnancy test was {Blank single:19197::"Negative","Not indicated"}. Consent was signed. Time out was performed.    Patient was positioned in dorsal lithotomy position. A vaginal speculum was placed.  The cervix was visualized and was prepped with Betadine.  A single-toothed tenaculum was placed on the anterior lip of the cervix to stabilize it. The 3 mm pipelle was easily introduced into the endometrial cavity without difficulty to a depth of *** cm, and a {Blank single:19197::"Scant","Moderate"} amount of tissue was obtained after two passes and sent to pathology. Attention was then turned to the polyps which were grasped with a ring forcep and removed with ease and sent for pathology. ***  The instruments were removed from the patient's vagina. Minimal bleeding from the cervix was noted.  The patient tolerated the procedure well.   Assessment and Plan:   1. Uterovaginal prolapse - Discussed finding of prolapse - Discussed options for intervention: Expectant management, PFPT,  pessary and surgery.  - Surgical options include native tissue repair vs mesh based surgery. Examples are AP repair and Sacrocolpopexy - Recommended referral to Urogynecology - She would like {Blank multiple:19196::"to consider her options","Surgery","PFPT","Referral to specialist","Pessary","Expectant Management"}   2. Abnormal uterine bleeding (AUB) - EMB done today - Reviewed AUB could be due to perimenopausal, due to polyps or fibroid or due to hyperplasia/malignancy. EMB done to rule this last category out.  - Reviewed options for management. Given the fibroid is primarily inside the uterus, she would not be a candidate for hysteroscopic resection.  - Discussed 3 main categories: expectant management, hormonal therapy or procedures. We also discussed TXA. We discussed hormonal therapy options I.e. BC options but also GNRH antagonists. Discussed surgical options ranging from D&C/ablation, [place], [place], or hysterectomy. I would use hysterectomy as a last option in her case unless desired as part of management for the prolapse.   3. Cervical polyp - Patient was given post procedure instructions.  Will follow up pathology and manage accordingly; patient will be contacted with results and recommendations.  Routine preventative health maintenance measures emphasized.    Diagnoses and all orders for this visit:  Uterovaginal prolapse  Abnormal uterine bleeding (AUB)  Cervical polyp    Routine preventative health maintenance measures emphasized. Please refer to After Visit Summary for other counseling recommendations.   No follow-ups on file.  Radene Gunning, MD, [place] [facility], [place]

## 2022-09-27 NOTE — Telephone Encounter (Signed)
  Follow up Call-     09/24/2022    3:06 PM  Call back number  Post procedure Call Back phone  # [phone]  Permission to leave phone message Yes     Patient questions:  Do you have a fever, pain , or abdominal swelling? No. Pain Score  0 *  Have you tolerated food without any problems? Yes.    Have you been able to return to your normal activities? Yes.    Do you have any questions about your discharge instructions: Diet   No. Medications  No. Follow up visit  No.  Do you have questions or concerns about your Care? No.  Actions: * If pain score is 4 or above: No action needed, pain <4.

## 2022-09-30 ENCOUNTER — Other Ambulatory Visit (HOSPITAL_COMMUNITY)
Admission: RE | Admit: 2022-09-30 | Discharge: 2022-09-30 | Disposition: A | Discharge status: Home / Self Care | Payer: 59 | Source: Ambulatory Visit | Attending: Obstetrics and Gynecology | Admitting: Obstetrics and Gynecology

## 2022-09-30 ENCOUNTER — Encounter: Payer: Self-pay | Admitting: Obstetrics and Gynecology

## 2022-09-30 ENCOUNTER — Ambulatory Visit: Payer: 59 | Admitting: Obstetrics and Gynecology

## 2022-09-30 VITALS — BP 118/75 | HR 70 | Ht 63.0 in | Wt 209.0 lb

## 2022-09-30 DIAGNOSIS — N9089 Other specified noninflammatory disorders of vulva and perineum: Secondary | ICD-10-CM

## 2022-09-30 DIAGNOSIS — N814 Uterovaginal prolapse, unspecified: Secondary | ICD-10-CM

## 2022-09-30 DIAGNOSIS — N939 Abnormal uterine and vaginal bleeding, unspecified: Secondary | ICD-10-CM | POA: Diagnosis not present

## 2022-09-30 DIAGNOSIS — N841 Polyp of cervix uteri: Secondary | ICD-10-CM | POA: Diagnosis not present

## 2022-09-30 DIAGNOSIS — Z01812 Encounter for preprocedural laboratory examination: Secondary | ICD-10-CM

## 2022-09-30 LAB — POCT URINE PREGNANCY: Preg Test, Ur: NEGATIVE

## 2022-09-30 MED ORDER — TRANEXAMIC ACID 650 MG PO TABS: 1300.0000 mg | ORAL_TABLET | Freq: Three times a day (TID) | ORAL | 2 refills | Status: DC

## 2022-10-05 LAB — SURGICAL PATHOLOGY

## 2022-11-04 ENCOUNTER — Other Ambulatory Visit: Payer: Self-pay | Admitting: Medical-Surgical

## 2022-11-04 MED ORDER — OSELTAMIVIR PHOSPHATE 75 MG PO CAPS: 75.0000 mg | ORAL_CAPSULE | Freq: Every day | ORAL | 0 refills | Status: DC

## 2022-11-22 ENCOUNTER — Telehealth: Payer: Self-pay | Admitting: General Practice

## 2022-11-22 NOTE — Transitions of Care (Post Inpatient/ED Visit) (Signed)
   11/22/2022  Name: Lydia Wilkins MRN: [id number] DOB: 1975-11-01  Today's TOC FU Call Status: Today's TOC FU Call Status:: Successful TOC FU Call Competed TOC FU Call Complete Date: 11/22/22  Transition Care Management Follow-up Telephone Call Date of Discharge: 11/21/22 Discharge Facility: Other ([facility]) Name of Other (Non-[facility]) Discharge Facility: [facility] Type of Discharge: Emergency Department Reason for ED Visit: Orthopedic Conditions Orthopedic/Injury Diagnosis:  (back pain) How have you been since you were released from the hospital?: Same Any questions or concerns?: No  Items Reviewed: Did you receive and understand the discharge instructions provided?: Yes Medications obtained and verified?: Yes (Medications Reviewed) Any new allergies since your discharge?: No Dietary orders reviewed?: NA Do you have support at home?: Yes  Home Care and Equipment/Supplies: [place] Ordered?: NA Any new equipment or medical supplies ordered?: NA  Functional Questionnaire: Do you need assistance with bathing/showering or dressing?: No Do you need assistance with meal preparation?: No Do you need assistance with eating?: No Do you have difficulty maintaining continence: No Do you need assistance with getting out of bed/getting out of a chair/moving?: No Do you have difficulty managing or taking your medications?: No  Follow up appointments reviewed: PCP Follow-up appointment confirmed?: NA Specialist Hospital Follow-up appointment confirmed?: Yes Date of Specialist follow-up appointment?: 11/30/22 Follow-Up Specialty Provider:: Dr. Darene Lamer Do you need transportation to your follow-up appointment?: No Do you understand care options if your condition(s) worsen?: Yes-patient verbalized understanding    SIGNATURE: Tinnie Gens, RN BSN

## 2022-11-30 ENCOUNTER — Ambulatory Visit: Payer: 59 | Admitting: Sports Medicine

## 2022-11-30 DIAGNOSIS — M5136 Other intervertebral disc degeneration, lumbar region: Secondary | ICD-10-CM | POA: Diagnosis not present

## 2022-11-30 DIAGNOSIS — M51369 Other intervertebral disc degeneration, lumbar region without mention of lumbar back pain or lower extremity pain: Secondary | ICD-10-CM | POA: Insufficient documentation

## 2022-11-30 NOTE — Assessment & Plan Note (Signed)
Pleasant 47 year old female, she has had several weeks of increasing low back pain, radicular symptoms down both legs to the bottom of both feet but not to the toes. Pain was worse with Valsalva. She went to the ED, x-rays showed mild lumbar DDD and facet arthropathy. She was given prednisone, Flexeril, she has improved considerably. I explained the anatomy and pathophysiology of lumbar disc disease. She will finish out her prednisone, do Flexeril only at night, adding formal physical therapy. I like to see her back in 6 weeks, we can do an MRI for interventional planning if not better. We will also do light duty at work.

## 2022-11-30 NOTE — Progress Notes (Signed)
Lydia Wilkins-              Lydia Wilkins    [id number]    Apr 03, 1976  Primary Care Physician:Jessup, Ander SladeJoy, NP  Referring Physician: Christen ButterJessup, Joy, NP [address] [place] [id number]   Chief complaint:   Chief Complaint  Patient presents with   Hemorrhoids    HPI: Patient was seen in ED on 11-21-22. Per that note, she was experiencing some mild abdominal pain.   Lydia Wilkins is a 47 y.o. female presenting to clinic today for consult for hemorrhoids at the request of her PCP NP Christen ButterJoy Jessup.  Today, she complains of hemorrhoids. She states that her hemorrhoids would occasionally bleed. She reports typically having a BM once every 3 days or so. She states she would drink more water to help her have a BM and denies taking any fiber or OTC medication.   She denies diarrhea, nausea, blood in stool, black stool, vomiting, abdominal pain, bloating, unintentional weight loss, reflux, dysphagia.  GI Hx:  Colonoscopy 09-24-22 - Non-bleeding external and internal hemorrhoids.  - The examination was otherwise normal.  - No specimens collected.   Current Outpatient Medications:    Multiple Vitamin (MULTIVITAMIN) capsule, Take 1 capsule by mouth daily., Disp: , Rfl:    oseltamivir (TAMIFLU) 75 MG capsule, Take 1 capsule (75 mg total) by mouth daily. (Patient not taking: Reported on 12/13/2022), Disp: 10 capsule, Rfl: 0   tranexamic acid (LYSTEDA) 650 MG TABS tablet, Take 2 tablets (1,300 mg total) by mouth 3 (three) times daily. Take during menses for a maximum of five days (Patient not taking: Reported on 12/13/2022), Disp: 30 tablet, Rfl: 2   Allergies as of 12/13/2022   (No Known Allergies)    Past Medical History:  Diagnosis Date   Allergy    Migraines     Past Surgical History:  Procedure Laterality Date   COLONOSCOPY  09/24/2022   NO PAST SURGERIES      Family History  Problem Relation Age of Onset   Diabetes Father    Diabetes Paternal Grandmother    Heart disease  Paternal Grandmother    Stroke Paternal Grandmother    Colon cancer Neg Hx    Colon polyps Neg Hx    Esophageal cancer Neg Hx    Rectal cancer Neg Hx    Stomach cancer Neg Hx     Social History   Socioeconomic History   Marital status: Married    Spouse name: Not on file   Number of children: Not on file   Years of education: Not on file   Highest education level: Associate degree: occupational, Scientist, product/process developmenttechnical, or vocational program  Occupational History   Not on file  Tobacco Use   Smoking status: Never   Smokeless tobacco: Never  Vaping Use   Vaping Use: Never used  Substance and Sexual Activity   Alcohol use: Not Currently   Drug use: Never   Sexual activity: Yes    Partners: Male    Birth control/protection: None  Other Topics Concern   Not on file  Social History Narrative   Not on file   Social Determinants of Health   Financial Resource Strain: Medium Risk (11/30/2022)   Overall Financial Resource Strain (CARDIA)    Difficulty of Paying Living Expenses: Somewhat hard  Food Insecurity: Food Insecurity Present (11/30/2022)   Hunger Vital Sign    Worried About Running Out of Food in the Last Year: Sometimes true  Ran Out of Food in the Last Year: Sometimes true  Transportation Needs: No Transportation Needs (11/30/2022)   PRAPARE - Administrator, Civil ServiceTransportation    Lack of Transportation (Medical): No    Lack of Transportation (Non-Medical): No  Physical Activity: Inactive (11/30/2022)   Exercise Vital Sign    Days of Exercise per Week: 1 day    Minutes of Exercise per Session: 0 min  Stress: Stress Concern Present (11/30/2022)   [employer] - Occupational Stress Questionnaire    Feeling of Stress : To some extent  Social Connections: Moderately Integrated (11/30/2022)   Social Connection and Isolation Panel [NHANES]    Frequency of Communication with Friends and Family: Once a week    Frequency of Social Gatherings with Friends and Family: Never     Attends Religious Services: 1 to 4 times per year    Active Member of [employer]ubs or Organizations: Yes    Attends BankerClub or Organization Meetings: 1 to 4 times per year    Marital Status: Married  Catering managerntimate Partner Violence: Not on file    Review of systems: Review of Systems  Gastrointestinal:  Positive for anal bleeding, constipation and rectal pain. Negative for abdominal distention, abdominal pain, blood in stool, diarrhea, nausea and vomiting.      Physical Exam: Vitals:   12/13/22 1414  BP: 132/76  Pulse: 69  SpO2: 96%   Body mass index is 41.64 kg/m.  General: well-appearing   Eyes: sclera anicteric, no redness GI: soft, no tenderness, with active bowel sounds. No guarding or palpable organomegaly noted. External and internal hemorrhoids.  Skin; warm and dry, no rash or jaundice noted Neuro: awake, alert and oriented x 3. Normal gross motor function and fluent speech   Data Reviewed:  Reviewed labs, radiology imaging, old records and pertinent past GI work up   Assessment and Plan/Recommendations: -Anusol  -Start Benefiber t tablespoon 2-3x a day  -Start Linzess   This visit required *** minutes of patient care (this includes precharting, chart review, review of results, face-to-face time used for counseling as well as treatment plan and follow-up. The patient was provided an opportunity to ask questions and all were answered. The patient agreed with the plan and demonstrated an understanding of the instructions.  Iona BeardK. Veena Colette Dicamillo , MD  CC: Christen ButterJessup, Joy, NP   Ladona MowI,Safa Hewitt ShortsM Kadhim,acting as a scribe for Marsa ArisKavitha Matilynn Dacey, MD.,have documented all relevant documentation on the behalf of Marsa ArisKavitha Alison Breeding, MD,as directed by  Marsa ArisKavitha Monae Topping, MD while in the presence of Marsa ArisKavitha Arrayah Connors, MD.   I, Marsa ArisKavitha Katarzyna Wolven, MD, have reviewed all documentation for this visit. The documentation on 12/13/22 for the exam, diagnosis, procedures, and orders are all accurate and complete.

## 2022-11-30 NOTE — Progress Notes (Signed)
    Procedures performed today:    None.  Independent interpretation of notes and tests performed by another provider:   None.  Brief History, Exam, Impression, and Recommendations:    Lumbar degenerative disc disease Pleasant 47 year old female, she has had several weeks of increasing low back pain, radicular symptoms down both legs to the bottom of both feet but not to the toes. Pain was worse with Valsalva. She went to the ED, x-rays showed mild lumbar DDD and facet arthropathy. She was given prednisone, Flexeril, she has improved considerably. I explained the anatomy and pathophysiology of lumbar disc disease. She will finish out her prednisone, do Flexeril only at night, adding formal physical therapy. I like to see her back in 6 weeks, we can do an MRI for interventional planning if not better. We will also do light duty at work.    ____________________________________________ Gwen Her. Dianah Field, M.D., ABFM., CAQSM., AME. Primary Care and Sports Medicine [facility]  Adjunct Professor of [place] [employer] [facility] [employer]  Risk manager

## 2022-12-08 ENCOUNTER — Telehealth: Payer: Self-pay

## 2022-12-08 NOTE — Telephone Encounter (Signed)
Patient came into office to drop off forms to be completed by PCP, forms handed to [place] to put Joy's box, thanks.

## 2022-12-09 NOTE — Telephone Encounter (Signed)
Patient needs appt to discuss

## 2022-12-10 ENCOUNTER — Other Ambulatory Visit: Payer: Self-pay

## 2022-12-10 ENCOUNTER — Encounter: Payer: Self-pay | Admitting: Physical Therapy

## 2022-12-10 ENCOUNTER — Ambulatory Visit: Payer: 59 | Attending: Sports Medicine | Admitting: Physical Therapy

## 2022-12-10 DIAGNOSIS — R2689 Other abnormalities of gait and mobility: Secondary | ICD-10-CM | POA: Insufficient documentation

## 2022-12-10 DIAGNOSIS — M5136 Other intervertebral disc degeneration, lumbar region: Secondary | ICD-10-CM

## 2022-12-10 DIAGNOSIS — M6281 Muscle weakness (generalized): Secondary | ICD-10-CM | POA: Diagnosis present

## 2022-12-10 NOTE — Therapy (Signed)
OUTPATIENT PHYSICAL THERAPY THORACOLUMBAR EVALUATION   Patient Name: Lydia Wilkins MRN: [id number] DOB:09/19/1975, 47 y.o., female Today's Date: 12/10/2022  END OF SESSION:  PT End of Session - 12/10/22 0923     Visit Number 1    Number of Visits 16    Date for PT Re-Evaluation 02/04/23    Authorization Type UHC    PT Start Time 0930    PT Stop Time 1015    PT Time Calculation (min) 45 min    Activity Tolerance Patient tolerated treatment well             Past Medical History:  Diagnosis Date   Allergy    Migraines    Past Surgical History:  Procedure Laterality Date   COLONOSCOPY  09/24/2022   NO PAST SURGERIES     Patient Active Problem List   Diagnosis Date Noted   Lumbar degenerative disc disease 11/30/2022   Depression with anxiety 05/15/2020   Insomnia 05/15/2020    PCP: Christen ButterJessup, Joy, NP  REFERRING PROVIDER: Monica Bectonhekkekandam, Thomas J, MD  REFERRING DIAG: M51.36 (ICD-10-CM) - Lumbar degenerative disc disease  Rationale for Evaluation and Treatment: Rehabilitation  THERAPY DIAG:  Lumbar degenerative disc disease  Muscle weakness (generalized)  Other abnormalities of gait and mobility  ONSET DATE: 12/10/22 acute exacerbation, chronic back pain x 7 years  SUBJECTIVE:                                                                                                                                                                                           SUBJECTIVE STATEMENT: Pt states when she was pregnant with her 47 year old she became stiff and couldn't bend her legs. Doctor told her she had sciatica. Pt was given muscle relaxer. Pt reports she has chronic back pain but has noted that she was having a stressful day and felt a big strain her back (~2-3 weeks ago) while going up the stairs. Does not note any method of injury. She states her back swelled up and got stiff. States she is having a hard time bending. Pt reports she eventually went to ER and was given a  shot and a muscle relaxer but it didn't help. Swelling has gone down and has been stretching more. Pt states it shoots down the left leg. Coughing/pressure will result in some urinary leakage.   PERTINENT HISTORY:  Ripped placenta  PAIN:  Are you having pain? Yes: NPRS scale: currently 5 (normally around a 5), at worst >10 /10 Pain location: low midback/sacrum, right now can feel it in her hips/buttocks Pain description: sharp pain down L leg Aggravating factors: Not sure,  bending a certain way or rotating a certain way Relieving factors: Heat  PRECAUTIONS: None  WEIGHT BEARING RESTRICTIONS: No  FALLS:  Has patient fallen in last 6 months? No  LIVING ENVIRONMENT: Lives with: lives with their family 3 boys Lives in: House/apartment Stairs: Yes: External: 3 floor apartment steps; bilateral but cannot reach both Has following equipment at home: None  OCCUPATION: Pt works at [employer] - has to lift, on feet all day  PLOF: Independent  PATIENT GOALS: Improve pain  NEXT MD VISIT: 12/13/22 with Christen Butter  OBJECTIVE:   DIAGNOSTIC FINDINGS:  Nothing recent  PATIENT SURVEYS:  FOTO 53; predicted 27  SCREENING FOR RED FLAGS: Bowel or bladder incontinence: No Spinal tumors: No Cauda equina syndrome: No Compression fracture: No Abdominal aneurysm: No  COGNITION: Overall cognitive status: Within functional limits for tasks assessed     SENSATION: WFL  MUSCLE LENGTH: Hamstrings: Right 70 deg; Left 90 deg Thomas test: Right -10 deg; Left neutral deg  POSTURE: increased lumbar lordosis and anterior pelvic tilt  PALPATION: TTP bilat glutes, piriformis, lumbar paraspinals, QL  LUMBAR ROM:   AROM eval  Flexion 50%*  Extension 50%  Right lateral flexion 100% Feels on R  Left lateral flexion 100% Feels on R  Right rotation 100%  Left rotation 100%   (Blank rows = not tested)  LOWER EXTREMITY ROM:     Active  Right eval Left eval  Hip flexion    Hip extension     Hip abduction    Hip adduction    Hip internal rotation    Hip external rotation    Knee flexion    Knee extension    Ankle dorsiflexion    Ankle plantarflexion    Ankle inversion    Ankle eversion     (Blank rows = not tested)  LOWER EXTREMITY MMT:    MMT Right eval Left eval  Hip flexion 4- 4  Hip extension 3 3  Hip abduction 3+ 3+  Hip adduction    Hip internal rotation    Hip external rotation    Knee flexion 4- 4  Knee extension 4 4  Ankle dorsiflexion    Ankle plantarflexion    Ankle inversion    Ankle eversion     (Blank rows = not tested)  LUMBAR SPECIAL TESTS:  Straight leg raise test: Positive, FABER test: Positive, and Thomas test: Positive FABER limited R hip ER vs L; Thomas test R hip flexed = pain in groin  FUNCTIONAL TESTS:  Did not assess this session  GAIT: Distance walked: 100' Assistive device utilized: None Level of assistance: Complete Independence Comments: Mildly antalgic  TODAY'S TREATMENT:                                                                                                                              DATE: 12/10/22  See HEP below  PATIENT EDUCATION:  Education details: Exam findings, POC, initial HEP  Person educated: Patient Education method: Explanation, Demonstration, and Handouts Education comprehension: verbalized understanding, returned demonstration, and needs further education  HOME EXERCISE PROGRAM: Access Code: [email or web address] URL: [email or web address] Date: 12/10/2022 Prepared by: Vernon PreyGellen April Kirstie PeriMarie Niranjan Rufener  Exercises - Supine Figure 4 Piriformis Stretch  - 1 x daily - 7 x weekly - 2 sets - 30 sec hold - Supine Piriformis Stretch with Foot on Ground  - 1 x daily - 7 x weekly - 2 sets - 30 sec hold - Supine Butterfly Groin Stretch  - 1 x daily - 7 x weekly - 2 sets - 30 sec hold - Seated Hamstring Stretch  - 1 x daily - 7 x weekly - 2 sets - 30 sec hold - Supine Bridge  - 1 x daily - 7 x weekly - 1  sets - 10 reps - 3-5 sec hold - Supine Transversus Abdominis Bracing with Pelvic Floor Contraction  - 1 x daily - 7 x weekly - 1 sets - 10 reps - 3-5 sec hold  ASSESSMENT:  CLINICAL IMPRESSION: Patient is a 47 y.o. F who was seen today for physical therapy evaluation and treatment for LBP. Assessment significant for pain closer towards bilat SIJ, bilat hip/lumbar muscle shortening, and bilat hip, core and pelvic floor weakness. Tenderness to palpation noted in glutes, piriformis, hamstrings, and lumbar paraspinals. Pt will benefit from PT to address these issues for decreased pain with mobility.   OBJECTIVE IMPAIRMENTS: Abnormal gait, decreased activity tolerance, decreased endurance, decreased mobility, difficulty walking, decreased ROM, decreased strength, hypomobility, increased fascial restrictions, increased muscle spasms, impaired flexibility, improper body mechanics, and pain.   ACTIVITY LIMITATIONS: lifting, bending, standing, transfers, locomotion level, and caring for others  PARTICIPATION LIMITATIONS: cleaning, shopping, community activity, occupation, and yard work  PERSONAL FACTORS: Age, Past/current experiences, Profession, and Time since onset of injury/illness/exacerbation are also affecting patient's functional outcome.   REHAB POTENTIAL: Good  CLINICAL DECISION MAKING: Evolving/moderate complexity  EVALUATION COMPLEXITY: Moderate   GOALS: Goals reviewed with patient? Yes  SHORT TERM GOALS: Target date: 01/07/2023   Pt will be ind with initial HEP Baseline: Goal status: INITIAL  2.  Pt will report decrease in her worst pain by 25% Baseline:  Goal status: INITIAL   LONG TERM GOALS: Target date: 02/04/2023   Pt will be ind with management and progression of HEP Baseline:  Goal status: INITIAL  2.  Pt will be able to perform plank x 30 sec to demo improved core strength Baseline:  Goal status: INITIAL  3.  Pt will demo L = R hip mobility with hamstring,  Thomas testing and FABER for improved hip extensibility Baseline:  Goal status: INITIAL  4.  Pt will report return of pain levels to her baseline Baseline:  Goal status: INITIAL  5.  Pt will have improved FOTO score to >/=71 Baseline:  Goal status: INITIAL   PLAN:  PT FREQUENCY: 1x/week  PT DURATION: 8 weeks  PLANNED INTERVENTIONS: Therapeutic exercises, Therapeutic activity, Neuromuscular re-education, Balance training, Gait training, Patient/Family education, Self Care, Joint mobilization, Stair training, Dry Needling, Cryotherapy, Moist heat, Taping, Ionotophoresis 4mg /ml Dexamethasone, Manual therapy, and Re-evaluation.  PLAN FOR NEXT SESSION: Assess response to HEP. Continue to work on core/PFM/diaphragm strengthening, hip mobility/strengthening. Manual work/TPDN as indicated for hips/low back.    Sherene Plancarte April Ma L Lujain Kraszewski, PT 12/10/2022, 11:56 AM

## 2022-12-13 ENCOUNTER — Ambulatory Visit: Payer: 59 | Admitting: Gastroenterology

## 2022-12-13 ENCOUNTER — Encounter: Payer: Self-pay | Admitting: Medical-Surgical

## 2022-12-13 ENCOUNTER — Encounter: Payer: Self-pay | Admitting: Gastroenterology

## 2022-12-13 ENCOUNTER — Ambulatory Visit: Payer: 59 | Admitting: Medical-Surgical

## 2022-12-13 VITALS — BP 132/76 | HR 69 | Ht 60.0 in | Wt 213.2 lb

## 2022-12-13 VITALS — BP 121/77 | HR 70 | Resp 20 | Ht 60.0 in | Wt 212.9 lb

## 2022-12-13 DIAGNOSIS — K5904 Chronic idiopathic constipation: Secondary | ICD-10-CM

## 2022-12-13 DIAGNOSIS — K641 Second degree hemorrhoids: Secondary | ICD-10-CM | POA: Diagnosis not present

## 2022-12-13 DIAGNOSIS — M5136 Other intervertebral disc degeneration, lumbar region: Secondary | ICD-10-CM

## 2022-12-13 MED ORDER — HYDROCORTISONE ACETATE 25 MG RE SUPP: 25.0000 mg | Freq: Every day | RECTAL | 0 refills | Status: DC

## 2022-12-13 NOTE — Assessment & Plan Note (Signed)
Pleasant 47 year old female recently diagnosed and treated for lumbar degenerative disc disease with accompanying sciatica.  She was evaluated at the emergency room on 3/17 and discharged on pain medications with muscle relaxers.  She was then evaluated in our office with Dr. Benjamin Stain on 3/26.  She was given work restrictions to limit lifting, walking, standing, bending, kneeling, etc.  Unfortunately, her job is not allowing her to come back to work with restrictions in place.  Today, she presents with FMLA papers that she would like filled out to cover this time out of work and any potential missed work from a flare of her symptoms.  Reports that her back pain is much improved although she does still have some bad days when she does too much around the house.  FMLA paperwork reviewed, discussed, and completed with patient input.  Originals provided to patient and copies faxed then scanned into her medical file.  Advised that if more paperwork is needed, she is welcome to see me or Dr. Benjamin Stain to have that taken care of.

## 2022-12-13 NOTE — Progress Notes (Signed)
        Established patient visit  History, exam, impression, and plan:  Lumbar degenerative disc disease Pleasant 47 year old female recently diagnosed and treated for lumbar degenerative disc disease with accompanying sciatica.  She was evaluated at the emergency room on 3/17 and discharged on pain medications with muscle relaxers.  She was then evaluated in our office with Dr. Benjamin Stain on 3/26.  She was given work restrictions to limit lifting, walking, standing, bending, kneeling, etc.  Unfortunately, her job is not allowing her to come back to work with restrictions in place.  Today, she presents with FMLA papers that she would like filled out to cover this time out of work and any potential missed work from a flare of her symptoms.  Reports that her back pain is much improved although she does still have some bad days when she does too much around the house.  FMLA paperwork reviewed, discussed, and completed with patient input.  Originals provided to patient and copies faxed then scanned into her medical file.  Advised that if more paperwork is needed, she is welcome to see me or Dr. Benjamin Stain to have that taken care of.   Procedures performed this visit: None.  Return if symptoms worsen or fail to improve.  __________________________________ Thayer Ohm, DNP, APRN, FNP-BC Primary Care and Sports Medicine [facility]

## 2022-12-13 NOTE — Patient Instructions (Addendum)
We have given you samples of the following medication to take: Linzess 145mg  take daily  Please purchase the following medications over the counter and take as directed: Benefiber- 1 tablespoon 2-3 times daily until as needed    We have sent the following medications to your pharmacy for you to pick up at your convenience: Anusol suppositories   We scheduled you for a follow up on 04/06/2023 at 1:50pm _____________________________________________________  If your blood pressure at your visit was 140/90 or greater, please contact your primary care physician to follow up on this.  _______________________________________________________  If you are age 81 or older, your body mass index should be between 23-30. Your Body mass index is 41.64 kg/m. If this is out of the aforementioned range listed, please consider follow up with your Primary Care Provider.  If you are age 40 or younger, your body mass index should be between 19-25. Your Body mass index is 41.64 kg/m. If this is out of the aformentioned range listed, please consider follow up with your Primary Care Provider.   ________________________________________________________  The [place] [facility] providers would like to encourage you to use [facility] to communicate with providers for non-urgent requests or questions.  Due to long hold times on the telephone, sending your provider a message by [facility] may be a faster and more efficient way to get a response.  Please allow 48 business hours for a response.  Please remember that this is for non-urgent requests.  _______________________________________________________ It was a pleasure to see you today!  Thank you for trusting me with your gastrointestinal care!

## 2022-12-16 ENCOUNTER — Encounter: Payer: Self-pay | Admitting: Physical Therapy

## 2022-12-16 ENCOUNTER — Ambulatory Visit: Payer: 59 | Admitting: Physical Therapy

## 2022-12-16 DIAGNOSIS — M5136 Other intervertebral disc degeneration, lumbar region: Secondary | ICD-10-CM | POA: Diagnosis not present

## 2022-12-16 DIAGNOSIS — R2689 Other abnormalities of gait and mobility: Secondary | ICD-10-CM

## 2022-12-16 DIAGNOSIS — M6281 Muscle weakness (generalized): Secondary | ICD-10-CM

## 2022-12-16 NOTE — Therapy (Signed)
OUTPATIENT PHYSICAL THERAPY THORACOLUMBAR TREATMENT   Patient Name: Lydia Wilkins MRN: [id number] DOB:[birth year], 47 y.o., female Today's Date: 12/16/2022  END OF SESSION:  PT End of Session - 12/16/22 0942     Visit Number 2    Number of Visits 16    Date for PT Re-Evaluation 02/04/23    Authorization Type [employer]    PT Start Time [id number]   pt few minutes late   PT Stop Time 1012    PT Time Calculation (min) 38 min    Activity Tolerance Patient tolerated treatment well    Behavior During Therapy [facility] for tasks assessed/performed              Past Medical History:  Diagnosis Date   Allergy    Migraines    Past Surgical History:  Procedure Laterality Date   COLONOSCOPY  09/24/2022   NO PAST SURGERIES     Patient Active Problem List   Diagnosis Date Noted   Lumbar degenerative disc disease 11/30/2022   Depression with anxiety 05/15/2020   Insomnia 05/15/2020    PCP: Christen Butter, NP  REFERRING PROVIDER: Monica Becton, MD  REFERRING DIAG: M51.36 (ICD-10-CM) - Lumbar degenerative disc disease  Rationale for Evaluation and Treatment: Rehabilitation  THERAPY DIAG:  Lumbar degenerative disc disease  Other abnormalities of gait and mobility  Muscle weakness (generalized)  ONSET DATE: 12/10/22 acute exacerbation, chronic back pain x 7 years  SUBJECTIVE:                                                                                                                                                                                           SUBJECTIVE STATEMENT:  Things are feeling OK, it doesn't stop me I'm ok. I only did HEP once this week because "everything happened, there was a lot going on. Nothing new since the eval, everything's hurting but its nothing that will bring me down.   EVAL:Pt states when she was pregnant with her 47 year old she became stiff and couldn't bend her legs. Doctor told her she had sciatica. Pt was given muscle relaxer. Pt reports she has  chronic back pain but has noted that she was having a stressful day and felt a big strain her back (~2-3 weeks ago) while going up the stairs. Does not note any method of injury. She states her back swelled up and got stiff. States she is having a hard time bending. Pt reports she eventually went to ER and was given a shot and a muscle relaxer but it didn't help. Swelling has gone down and has been stretching more. Pt states it shoots down  the left leg. Coughing/pressure will result in some urinary leakage.   PERTINENT HISTORY:  Ripped placenta  PAIN:  Are you having pain? Yes: NPRS scale: 6/10 Pain location: low back, still going down into hips B  Pain description: pressure  Aggravating factors: bending a certain way or lifting something  Relieving factors: stretching, heat, sometimes massage gun  PRECAUTIONS: None  WEIGHT BEARING RESTRICTIONS: No  FALLS:  Has patient fallen in last 6 months? No  LIVING ENVIRONMENT: Lives with: lives with their family 3 boys Lives in: House/apartment Stairs: Yes: External: 3 floor apartment steps; bilateral but cannot reach both Has following equipment at home: None  OCCUPATION: Pt works at [employer] - has to lift, on feet all day  PLOF: Independent  PATIENT GOALS: Improve pain  NEXT MD VISIT: 12/13/22 with Christen Butter  OBJECTIVE:   DIAGNOSTIC FINDINGS:  Nothing recent  PATIENT SURVEYS:  FOTO 53; predicted 73  SCREENING FOR RED FLAGS: Bowel or bladder incontinence: No Spinal tumors: No Cauda equina syndrome: No Compression fracture: No Abdominal aneurysm: No  COGNITION: Overall cognitive status: Within functional limits for tasks assessed     SENSATION: WFL  MUSCLE LENGTH: Hamstrings: Right 70 deg; Left 90 deg Thomas test: Right -10 deg; Left neutral deg  POSTURE: increased lumbar lordosis and anterior pelvic tilt  PALPATION: TTP bilat glutes, piriformis, lumbar paraspinals, QL  LUMBAR ROM:   AROM eval  Flexion 50%*   Extension 50%  Right lateral flexion 100% Feels on R  Left lateral flexion 100% Feels on R  Right rotation 100%  Left rotation 100%   (Blank rows = not tested)  LOWER EXTREMITY ROM:     Active  Right eval Left eval  Hip flexion    Hip extension    Hip abduction    Hip adduction    Hip internal rotation    Hip external rotation    Knee flexion    Knee extension    Ankle dorsiflexion    Ankle plantarflexion    Ankle inversion    Ankle eversion     (Blank rows = not tested)  LOWER EXTREMITY MMT:    MMT Right eval Left eval  Hip flexion 4- 4  Hip extension 3 3  Hip abduction 3+ 3+  Hip adduction    Hip internal rotation    Hip external rotation    Knee flexion 4- 4  Knee extension 4 4  Ankle dorsiflexion    Ankle plantarflexion    Ankle inversion    Ankle eversion     (Blank rows = not tested)  LUMBAR SPECIAL TESTS:  Straight leg raise test: Positive, FABER test: Positive, and Thomas test: Positive FABER limited R hip ER vs L; Thomas test R hip flexed = pain in groin  FUNCTIONAL TESTS:  Did not assess this session  GAIT: Distance walked: 100' Assistive device utilized: None Level of assistance: Complete Independence Comments: Mildly antalgic  TODAY'S TREATMENT:  DATE:   12/16/22  TherEx  Nustep L5 x6 minutes BLEs only Corliss Marcus with 3 second holds  PPT x15 with 3 second holds SKTC 5x5 second holds B Sidelying clams red TB x15 B PPT + march x10  QL stretch 3x30 seconds seated TA set + seated march x10 B      12/10/22  See HEP below  PATIENT EDUCATION:  Education details: Exam findings, POC, initial HEP Person educated: Patient Education method: Explanation, Demonstration, and Handouts Education comprehension: verbalized understanding, returned demonstration, and needs further education  HOME EXERCISE  PROGRAM: Access Code: [email or web address] URL: [email or web address] Date: 12/10/2022 Prepared by: Vernon Prey April Kirstie Peri  Exercises - Supine Figure 4 Piriformis Stretch  - 1 x daily - 7 x weekly - 2 sets - 30 sec hold - Supine Piriformis Stretch with Foot on Ground  - 1 x daily - 7 x weekly - 2 sets - 30 sec hold - Supine Butterfly Groin Stretch  - 1 x daily - 7 x weekly - 2 sets - 30 sec hold - Seated Hamstring Stretch  - 1 x daily - 7 x weekly - 2 sets - 30 sec hold - Supine Bridge  - 1 x daily - 7 x weekly - 1 sets - 10 reps - 3-5 sec hold - Supine Transversus Abdominis Bracing with Pelvic Floor Contraction  - 1 x daily - 7 x weekly - 1 sets - 10 reps - 3-5 sec hold  ASSESSMENT:  CLINICAL IMPRESSION:  Deshay arrives today doing OK, didn't have a lot of time to do HEP this week due to having a very busy week. Warmed up on Nustep, then focused on core and hip strengthening this session. Did OK, did not add anything to HEP- would like her to be more consistent with existing program before adding to it.   OBJECTIVE IMPAIRMENTS: Abnormal gait, decreased activity tolerance, decreased endurance, decreased mobility, difficulty walking, decreased ROM, decreased strength, hypomobility, increased fascial restrictions, increased muscle spasms, impaired flexibility, improper body mechanics, and pain.   ACTIVITY LIMITATIONS: lifting, bending, standing, transfers, locomotion level, and caring for others  PARTICIPATION LIMITATIONS: cleaning, shopping, community activity, occupation, and yard work  PERSONAL FACTORS: Age, Past/current experiences, Profession, and Time since onset of injury/illness/exacerbation are also affecting patient's functional outcome.   REHAB POTENTIAL: Good  CLINICAL DECISION MAKING: Evolving/moderate complexity  EVALUATION COMPLEXITY: Moderate   GOALS: Goals reviewed with patient? Yes  SHORT TERM GOALS: Target date: 01/07/2023   Pt will be ind with initial  HEP Baseline: Goal status: INITIAL  2.  Pt will report decrease in her worst pain by 25% Baseline:  Goal status: INITIAL   LONG TERM GOALS: Target date: 02/04/2023   Pt will be ind with management and progression of HEP Baseline:  Goal status: INITIAL  2.  Pt will be able to perform plank x 30 sec to demo improved core strength Baseline:  Goal status: INITIAL  3.  Pt will demo L = R hip mobility with hamstring, Thomas testing and FABER for improved hip extensibility Baseline:  Goal status: INITIAL  4.  Pt will report return of pain levels to her baseline Baseline:  Goal status: INITIAL  5.  Pt will have improved FOTO score to >/=71 Baseline:  Goal status: INITIAL   PLAN:  PT FREQUENCY: 1x/week  PT DURATION: 8 weeks  PLANNED INTERVENTIONS: Therapeutic exercises, Therapeutic activity, Neuromuscular re-education, Balance training, Gait training, Patient/Family education, Self Care, Joint mobilization, Stair training, Dry Needling, Cryotherapy, Moist heat,  Taping, Ionotophoresis 4mg /ml Dexamethasone, Manual therapy, and Re-evaluation.  PLAN FOR NEXT SESSION: Assess response to HEP. Continue to work on core/PFM/diaphragm strengthening, hip mobility/strengthening. Manual work/TPDN as indicated for hips/low back. Has she been consistent with HEP?   Nedra HaiKristen Ehsan Corvin PT DPT PN2

## 2022-12-20 ENCOUNTER — Ambulatory Visit: Payer: 59 | Admitting: Medical-Surgical

## 2022-12-23 ENCOUNTER — Encounter: Payer: Self-pay | Admitting: Physical Therapy

## 2022-12-23 ENCOUNTER — Ambulatory Visit: Payer: 59 | Admitting: Physical Therapy

## 2022-12-23 ENCOUNTER — Encounter: Payer: Self-pay | Admitting: Medical-Surgical

## 2022-12-23 ENCOUNTER — Telehealth: Payer: Self-pay

## 2022-12-23 DIAGNOSIS — R2689 Other abnormalities of gait and mobility: Secondary | ICD-10-CM

## 2022-12-23 DIAGNOSIS — M5136 Other intervertebral disc degeneration, lumbar region: Secondary | ICD-10-CM

## 2022-12-23 DIAGNOSIS — M6281 Muscle weakness (generalized): Secondary | ICD-10-CM

## 2022-12-23 NOTE — Telephone Encounter (Signed)
Patient left a vm msg stating that [facility] department is not allowing her to return to work. Per patient, she needs a letter from the provider stating she can return back to work without any restrictions. Pls advise, thanks.

## 2022-12-23 NOTE — Therapy (Signed)
OUTPATIENT PHYSICAL THERAPY THORACOLUMBAR TREATMENT   Patient Name: Lydia Wilkins MRN: [id number] DOB:August 23, 1976, 47 y.o., female Today's Date: 12/23/2022  END OF SESSION:  PT End of Session - 12/23/22 0944     Visit Number 3    Number of Visits 16    Date for PT Re-Evaluation 02/04/23    Authorization Type UHC    PT Start Time [phone]   pt arrived late   PT Stop Time 1011    PT Time Calculation (min) 34 min    Activity Tolerance Patient tolerated treatment well    Behavior During Therapy [facility] for tasks assessed/performed               Past Medical History:  Diagnosis Date   Allergy    Migraines    Past Surgical History:  Procedure Laterality Date   COLONOSCOPY  09/24/2022   NO PAST SURGERIES     Patient Active Problem List   Diagnosis Date Noted   Lumbar degenerative disc disease 11/30/2022   Depression with anxiety 05/15/2020   Insomnia 05/15/2020    PCP: Christen Butter, NP  REFERRING PROVIDER: Monica Becton, MD  REFERRING DIAG: M51.36 (ICD-10-CM) - Lumbar degenerative disc disease  Rationale for Evaluation and Treatment: Rehabilitation  THERAPY DIAG:  Lumbar degenerative disc disease  Other abnormalities of gait and mobility  Muscle weakness (generalized)  ONSET DATE: 12/10/22 acute exacerbation, chronic back pain x 7 years  SUBJECTIVE:                                                                                                                                                                                           SUBJECTIVE STATEMENT:  I'm hurting today but its my fault, I was cleaning  my boy's room and rearranging furniture and did too much. Then my boy started climbing on me when I was doing the dishes. Felt good after last time, did some of those exercises at home too   EVAL:Pt states when she was pregnant with her 47 year old she became stiff and couldn't bend her legs. Doctor told her she had sciatica. Pt was given muscle relaxer. Pt  reports she has chronic back pain but has noted that she was having a stressful day and felt a big strain her back (~2-3 weeks ago) while going up the stairs. Does not note any method of injury. She states her back swelled up and got stiff. States she is having a hard time bending. Pt reports she eventually went to ER and was given a shot and a muscle relaxer but it didn't help. Swelling has gone down and has been stretching  more. Pt states it shoots down the left leg. Coughing/pressure will result in some urinary leakage.   PERTINENT HISTORY:  Ripped placenta  PAIN:  Are you having pain? Yes: NPRS scale: 7/10 Pain location: low back, still going down into hips B  L>R Pain description: more like an ache, feels like I'm limping Aggravating factors: doing too much in general Relieving factors: exercises and stretches  PRECAUTIONS: None  WEIGHT BEARING RESTRICTIONS: No  FALLS:  Has patient fallen in last 6 months? No  LIVING ENVIRONMENT: Lives with: lives with their family 3 boys Lives in: House/apartment Stairs: Yes: External: 3 floor apartment steps; bilateral but cannot reach both Has following equipment at home: None  OCCUPATION: Pt works at [employer] - has to lift, on feet all day  PLOF: Independent  PATIENT GOALS: Improve pain  NEXT MD VISIT: 12/13/22 with Christen Butter  OBJECTIVE:   DIAGNOSTIC FINDINGS:  Nothing recent  PATIENT SURVEYS:  FOTO 53; predicted 57  SCREENING FOR RED FLAGS: Bowel or bladder incontinence: No Spinal tumors: No Cauda equina syndrome: No Compression fracture: No Abdominal aneurysm: No  COGNITION: Overall cognitive status: Within functional limits for tasks assessed     SENSATION: WFL  MUSCLE LENGTH: Hamstrings: Right 70 deg; Left 90 deg Thomas test: Right -10 deg; Left neutral deg  POSTURE: increased lumbar lordosis and anterior pelvic tilt  PALPATION: TTP bilat glutes, piriformis, lumbar paraspinals, QL  LUMBAR ROM:   AROM  eval  Flexion 50%*  Extension 50%  Right lateral flexion 100% Feels on R  Left lateral flexion 100% Feels on R  Right rotation 100%  Left rotation 100%   (Blank rows = not tested)  LOWER EXTREMITY ROM:     Active  Right eval Left eval  Hip flexion    Hip extension    Hip abduction    Hip adduction    Hip internal rotation    Hip external rotation    Knee flexion    Knee extension    Ankle dorsiflexion    Ankle plantarflexion    Ankle inversion    Ankle eversion     (Blank rows = not tested)  LOWER EXTREMITY MMT:    MMT Right eval Left eval  Hip flexion 4- 4  Hip extension 3 3  Hip abduction 3+ 3+  Hip adduction    Hip internal rotation    Hip external rotation    Knee flexion 4- 4  Knee extension 4 4  Ankle dorsiflexion    Ankle plantarflexion    Ankle inversion    Ankle eversion     (Blank rows = not tested)  LUMBAR SPECIAL TESTS:  Straight leg raise test: Positive, FABER test: Positive, and Thomas test: Positive FABER limited R hip ER vs L; Thomas test R hip flexed = pain in groin  FUNCTIONAL TESTS:  Did not assess this session  GAIT: Distance walked: 100' Assistive device utilized: None Level of assistance: Complete Independence Comments: Mildly antalgic  TODAY'S TREATMENT:  DATE:   12/23/22  Bridges + ABD into green TB x10  Sidelying clams green TB x15 B Standing hip hikes x20 B Standing hip hikes + ABD x10 B Biomechanics for floor to waist lifting, bed mobility  PPT x20 with 3 second holds PPT + march x15 B Curl up x10 with 3 second holds Wiggle worms x10 B (alternating), supine Seated TA sets x10 with 3 second holds  Seated TA sets + march x10 B  Seated TA sets + alternating opposite UE/LE flexion x10     12/16/22  TherEx  Nustep L5 x6 minutes BLEs only Bridges x12 with 3 second holds  PPT x15 with 3  second holds SKTC 5x5 second holds B Sidelying clams red TB x15 B PPT + march x10  QL stretch 3x30 seconds seated TA set + seated march x10 B      12/10/22  See HEP below  PATIENT EDUCATION:  Education details: Exam findings, POC, initial HEP Person educated: Patient Education method: Explanation, Demonstration, and Handouts Education comprehension: verbalized understanding, returned demonstration, and needs further education  HOME EXERCISE PROGRAM: Access Code: [email or web address] URL: [email or web address] Date: 12/10/2022 Prepared by: Vernon Prey April Kirstie Peri  Exercises - Supine Figure 4 Piriformis Stretch  - 1 x daily - 7 x weekly - 2 sets - 30 sec hold - Supine Piriformis Stretch with Foot on Ground  - 1 x daily - 7 x weekly - 2 sets - 30 sec hold - Supine Butterfly Groin Stretch  - 1 x daily - 7 x weekly - 2 sets - 30 sec hold - Seated Hamstring Stretch  - 1 x daily - 7 x weekly - 2 sets - 30 sec hold - Supine Bridge  - 1 x daily - 7 x weekly - 1 sets - 10 reps - 3-5 sec hold - Supine Transversus Abdominis Bracing with Pelvic Floor Contraction  - 1 x daily - 7 x weekly - 1 sets - 10 reps - 3-5 sec hold  ASSESSMENT:  CLINICAL IMPRESSION:  Lashuna arrives today doing OK, felt better after last session but really did a lot cleaning her son's room with heavy lifting and caused a lot of pain to return. Progressed all activities from last session as tolerated since she felt good after last time. Will continue efforts, encouraged activity modification and reviewed good biomechanics as well.   OBJECTIVE IMPAIRMENTS: Abnormal gait, decreased activity tolerance, decreased endurance, decreased mobility, difficulty walking, decreased ROM, decreased strength, hypomobility, increased fascial restrictions, increased muscle spasms, impaired flexibility, improper body mechanics, and pain.   ACTIVITY LIMITATIONS: lifting, bending, standing, transfers, locomotion level, and caring for  others  PARTICIPATION LIMITATIONS: cleaning, shopping, community activity, occupation, and yard work  PERSONAL FACTORS: Age, Past/current experiences, Profession, and Time since onset of injury/illness/exacerbation are also affecting patient's functional outcome.   REHAB POTENTIAL: Good  CLINICAL DECISION MAKING: Evolving/moderate complexity  EVALUATION COMPLEXITY: Moderate   GOALS: Goals reviewed with patient? Yes  SHORT TERM GOALS: Target date: 01/07/2023   Pt will be ind with initial HEP Baseline: Goal status: INITIAL  2.  Pt will report decrease in her worst pain by 25% Baseline:  Goal status: INITIAL   LONG TERM GOALS: Target date: 02/04/2023   Pt will be ind with management and progression of HEP Baseline:  Goal status: INITIAL  2.  Pt will be able to perform plank x 30 sec to demo improved core strength Baseline:  Goal status: INITIAL  3.  Pt will demo L =  R hip mobility with hamstring, Thomas testing and FABER for improved hip extensibility Baseline:  Goal status: INITIAL  4.  Pt will report return of pain levels to her baseline Baseline:  Goal status: INITIAL  5.  Pt will have improved FOTO score to >/=71 Baseline:  Goal status: INITIAL   PLAN:  PT FREQUENCY: 1x/week  PT DURATION: 8 weeks  PLANNED INTERVENTIONS: Therapeutic exercises, Therapeutic activity, Neuromuscular re-education, Balance training, Gait training, Patient/Family education, Self Care, Joint mobilization, Stair training, Dry Needling, Cryotherapy, Moist heat, Taping, Ionotophoresis 4mg /ml Dexamethasone, Manual therapy, and Re-evaluation.  PLAN FOR NEXT SESSION: Assess response to HEP. Continue to work on core/PFM/diaphragm strengthening, hip mobility/strengthening. Manual work/TPDN as indicated for hips/low back. Has she been consistent with HEP? Continue to promote good biomechanics. Will need more appts if she wants to continue   Nedra Hai PT DPT PN2

## 2022-12-30 ENCOUNTER — Ambulatory Visit: Payer: 59 | Admitting: Physical Therapy

## 2023-01-11 ENCOUNTER — Ambulatory Visit: Payer: 59 | Admitting: Sports Medicine

## 2023-03-11 ENCOUNTER — Other Ambulatory Visit: Payer: Self-pay | Admitting: Medical-Surgical

## 2023-03-11 DIAGNOSIS — Z1231 Encounter for screening mammogram for malignant neoplasm of breast: Secondary | ICD-10-CM

## 2023-03-17 ENCOUNTER — Encounter: Payer: Self-pay | Admitting: Medical-Surgical

## 2023-03-18 ENCOUNTER — Encounter: Payer: Self-pay | Admitting: Medical-Surgical

## 2023-03-18 ENCOUNTER — Ambulatory Visit (INDEPENDENT_AMBULATORY_CARE_PROVIDER_SITE_OTHER): Payer: 59 | Admitting: Medical-Surgical

## 2023-03-18 VITALS — BP 110/77 | HR 62 | Ht 60.0 in | Wt 214.5 lb

## 2023-03-18 DIAGNOSIS — R7303 Prediabetes: Secondary | ICD-10-CM

## 2023-03-18 DIAGNOSIS — Z0000 Encounter for general adult medical examination without abnormal findings: Secondary | ICD-10-CM | POA: Diagnosis not present

## 2023-03-18 DIAGNOSIS — R5383 Other fatigue: Secondary | ICD-10-CM

## 2023-03-18 DIAGNOSIS — Z1231 Encounter for screening mammogram for malignant neoplasm of breast: Secondary | ICD-10-CM

## 2023-03-18 DIAGNOSIS — E785 Hyperlipidemia, unspecified: Secondary | ICD-10-CM | POA: Diagnosis not present

## 2023-03-18 DIAGNOSIS — F418 Other specified anxiety disorders: Secondary | ICD-10-CM

## 2023-03-18 DIAGNOSIS — R0683 Snoring: Secondary | ICD-10-CM

## 2023-03-18 LAB — CBC WITH DIFFERENTIAL/PLATELET
Basophils Absolute: 29 cells/uL (ref 0–200)
Basophils Relative: 0.4 %
Eosinophils Relative: 1.6 %
HCT: 37.5 % (ref 35.0–45.0)
MCV: 79.1 fL — ABNORMAL LOW (ref 80.0–100.0)
MPV: 11.5 fL (ref 7.5–12.5)
Neutro Abs: 3840 cells/uL (ref 1500–7800)
Platelets: 240 10*3/uL (ref 140–400)
Total Lymphocyte: 37.7 %
WBC: 7.3 10*3/uL (ref 3.8–10.8)

## 2023-03-18 NOTE — Progress Notes (Signed)
Complete physical exam  Patient: Lydia Wilkins   DOB: August 05, 1976   47 y.o. Female  MRN: [id number]  Subjective:    Chief Complaint  Patient presents with   Annual Exam    Pt is here for her physical. Also is wanting to have a referral for mammogram as she has felt a lump at left breast.   Lydia Wilkins is a 47 y.o. female who presents today for a complete physical exam. She reports consuming a general diet.  Walking and stretching  She generally feels fairly well. She reports sleeping poorly. She does not have additional problems to discuss today.    Most recent fall risk assessment:    03/18/2023   10:09 AM  Fall Risk   Falls in the past year? 0  Number falls in past yr: 0  Injury with Fall? 0  Risk for fall due to : No Fall Risks  Follow up Falls evaluation completed     Most recent depression screenings:    03/18/2023   10:09 AM 12/13/2022    4:29 PM  PHQ 2/9 Scores  PHQ - 2 Score 1 1  PHQ- 9 Score 10     Vision:Within last year, Dental: No current dental problems and Receives regular dental care, and STD: The patient denies history of sexually transmitted disease.    Patient Care Team: Christen Butter, NP as PCP - General (Nurse Practitioner)   Outpatient Medications Prior to Visit  Medication Sig   Multiple Vitamin (MULTIVITAMIN) capsule Take 1 capsule by mouth daily.   [DISCONTINUED] hydrocortisone (ANUSOL-HC) 25 MG suppository Place 1 suppository (25 mg total) rectally at bedtime.   No facility-administered medications prior to visit.    Review of Systems  Constitutional:  Negative for chills, fever, malaise/fatigue and weight loss.  HENT:  Negative for congestion, ear pain, hearing loss, sinus pain and sore throat.   Eyes:  Negative for blurred vision, photophobia and pain.  Respiratory:  Negative for cough, shortness of breath and wheezing.   Cardiovascular:  Negative for chest pain, palpitations and leg swelling.  Gastrointestinal:  Negative for abdominal  pain, constipation, diarrhea, heartburn, nausea and vomiting.  Genitourinary:  Negative for dysuria, frequency and urgency.  Musculoskeletal:  Negative for falls and neck pain.  Skin:  Negative for itching and rash.  Neurological:  Positive for dizziness and headaches. Negative for weakness.  Endo/Heme/Allergies:  Negative for polydipsia. Does not bruise/bleed easily.  Psychiatric/Behavioral:  Positive for depression. Negative for substance abuse and suicidal ideas. The patient is nervous/anxious and has insomnia.      Objective:     BP 110/77 (BP Location: Left Arm, Patient Position: Sitting, Cuff Size: Large)   Pulse 62   Ht 5' (1.524 m)   Wt 214 lb 8 oz (97.3 kg)   SpO2 98%   BMI 41.89 kg/m    Physical Exam Vitals reviewed.  Constitutional:      General: She is not in acute distress.    Appearance: Normal appearance. She is obese. She is not ill-appearing.  HENT:     Head: Normocephalic and atraumatic.     Right Ear: Tympanic membrane, ear canal and external ear normal. There is no impacted cerumen.     Left Ear: Tympanic membrane, ear canal and external ear normal. There is no impacted cerumen.     Nose: Nose normal. No congestion or rhinorrhea.     Mouth/Throat:     Mouth: Mucous membranes are moist.     Pharynx: No  oropharyngeal exudate or posterior oropharyngeal erythema.  Eyes:     General: No scleral icterus.       Right eye: No discharge.        Left eye: No discharge.     Extraocular Movements: Extraocular movements intact.     Conjunctiva/sclera: Conjunctivae normal.     Pupils: Pupils are equal, round, and reactive to light.  Neck:     Thyroid: No thyromegaly.     Vascular: No carotid bruit or JVD.     Trachea: Trachea normal.  Cardiovascular:     Rate and Rhythm: Normal rate and regular rhythm.     Pulses: Normal pulses.     Heart sounds: Normal heart sounds. No murmur heard.    No friction rub. No gallop.  Pulmonary:     Effort: Pulmonary effort is  normal. No respiratory distress.     Breath sounds: Normal breath sounds. No wheezing.  Abdominal:     General: Bowel sounds are normal. There is no distension.     Palpations: Abdomen is soft.     Tenderness: There is no abdominal tenderness. There is no guarding.  Musculoskeletal:        General: Normal range of motion.     Cervical back: Normal range of motion and neck supple.  Lymphadenopathy:     Cervical: No cervical adenopathy.  Skin:    General: Skin is warm and dry.  Neurological:     Mental Status: She is alert and oriented to person, place, and time.     Cranial Nerves: No cranial nerve deficit.  Psychiatric:        Mood and Affect: Mood normal.        Behavior: Behavior normal.        Thought Content: Thought content normal.        Judgment: Judgment normal.     No results found for any visits on 03/18/23.     Assessment & Plan:    Routine Health Maintenance and Physical Exam  Immunization History  Administered Date(s) Administered   PPD Test 06/10/2021   Tdap 05/15/2020    Health Maintenance  Topic Date Due   HIV Screening  Never done   Hepatitis C Screening  Never done   COVID-19 Vaccine (1 - 2023-24 season) Never done   INFLUENZA VACCINE  04/07/2023   PAP SMEAR-Modifier  08/19/2025   PAP-Cervical Cytology Screening  08/20/2027   DTaP/Tdap/Td (2 - Td or Tdap) 05/15/2030   Colonoscopy  09/24/2032   HPV VACCINES  Aged Out    Discussed health benefits of physical activity, and encouraged her to engage in regular exercise appropriate for her age and condition.  1. Annual physical exam Checking labs as below. UTD on preventative care. Wellness information provided with AVS.  - Lipid panel - COMPLETE METABOLIC PANEL WITH GFR - CBC with Differential/Platelet  2. Encounter for screening mammogram for malignant neoplasm of breast Mammogram ordered. - MM DIGITAL SCREENING BILATERAL; Future  3. Prediabetes Checking A1c. - Hemoglobin A1c  4.  Hyperlipidemia, unspecified hyperlipidemia type Checking labs today.  - Lipid panel - COMPLETE METABOLIC PANEL WITH GFR  5. Loud snoring 6. Fatigue, unspecified type STOPBANG score of 4, Epworth Sleepiness Scale score of 7. Ordering home sleep study.  - Home sleep test; Future  7. Depression with anxiety Referring to behavioral health for counseling. Discussed medications but will defer for now.  - Ambulatory referral to Behavioral Health  Return in about 1 year (around 03/17/2024) for  annual physical exam.   Christen Butter, NP

## 2023-03-19 LAB — LIPID PANEL
Cholesterol: 161 mg/dL
HDL: 45 mg/dL — ABNORMAL LOW
LDL Cholesterol (Calc): 98 mg/dL
Non-HDL Cholesterol (Calc): 116 mg/dL
Total CHOL/HDL Ratio: 3.6 (calc)
Triglycerides: 85 mg/dL

## 2023-03-19 LAB — CBC WITH DIFFERENTIAL/PLATELET
Absolute Monocytes: 562 cells/uL (ref 200–950)
Eosinophils Absolute: 117 cells/uL (ref 15–500)
Hemoglobin: 12 g/dL (ref 11.7–15.5)
Lymphs Abs: 2752 cells/uL (ref 850–3900)
MCH: 25.3 pg — ABNORMAL LOW (ref 27.0–33.0)
MCHC: 32 g/dL (ref 32.0–36.0)
Monocytes Relative: 7.7 %
Neutrophils Relative %: 52.6 %
RBC: 4.74 10*6/uL (ref 3.80–5.10)
RDW: 13.9 % (ref 11.0–15.0)

## 2023-03-19 LAB — COMPLETE METABOLIC PANEL WITH GFR
AG Ratio: 1.7 (calc) (ref 1.0–2.5)
ALT: 16 U/L (ref 6–29)
AST: 12 U/L (ref 10–35)
Albumin: 4.4 g/dL (ref 3.6–5.1)
Alkaline phosphatase (APISO): 66 U/L (ref 31–125)
BUN: 15 mg/dL (ref 7–25)
CO2: 26 mmol/L (ref 20–32)
Calcium: 9.1 mg/dL (ref 8.6–10.2)
Chloride: 105 mmol/L (ref 98–110)
Creat: 0.68 mg/dL (ref 0.50–0.99)
Globulin: 2.6 g/dL (calc) (ref 1.9–3.7)
Glucose, Bld: 80 mg/dL (ref 65–99)
Potassium: 4.2 mmol/L (ref 3.5–5.3)
Sodium: 140 mmol/L (ref 135–146)
Total Bilirubin: 0.3 mg/dL (ref 0.2–1.2)
Total Protein: 7 g/dL (ref 6.1–8.1)
eGFR: 109 mL/min/{1.73_m2} (ref >=60)

## 2023-03-19 LAB — HEMOGLOBIN A1C
Hgb A1c MFr Bld: 5.9 %{Hb} — ABNORMAL HIGH
Mean Plasma Glucose: 123 mg/dL
eAG (mmol/L): 6.8 mmol/L

## 2023-03-21 ENCOUNTER — Telehealth: Payer: Self-pay | Admitting: Medical-Surgical

## 2023-03-21 NOTE — Telephone Encounter (Signed)
Pt returned your call.  I read her lab results to her but she still wants a return call.  Please advise.

## 2023-03-28 ENCOUNTER — Encounter: Payer: Self-pay | Admitting: Medical-Surgical

## 2023-03-28 DIAGNOSIS — Z1231 Encounter for screening mammogram for malignant neoplasm of breast: Secondary | ICD-10-CM

## 2023-04-06 ENCOUNTER — Ambulatory Visit: Payer: 59 | Admitting: Gastroenterology

## 2023-04-27 ENCOUNTER — Ambulatory Visit (INDEPENDENT_AMBULATORY_CARE_PROVIDER_SITE_OTHER): Payer: 59

## 2023-04-27 DIAGNOSIS — Z1231 Encounter for screening mammogram for malignant neoplasm of breast: Secondary | ICD-10-CM | POA: Diagnosis not present

## 2023-06-08 ENCOUNTER — Encounter (HOSPITAL_COMMUNITY): Payer: Self-pay

## 2023-06-08 ENCOUNTER — Ambulatory Visit (INDEPENDENT_AMBULATORY_CARE_PROVIDER_SITE_OTHER): Payer: 59 | Admitting: Licensed Clinical Social Worker

## 2023-06-08 DIAGNOSIS — F4323 Adjustment disorder with mixed anxiety and depressed mood: Secondary | ICD-10-CM | POA: Diagnosis not present
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
## 2023-08-10 IMAGING — MG MM DIGITAL SCREENING BILAT W/ TOMO AND CAD
6 of 12 series · 6 of 36 positions shown · non-contrast
Comparison: None.

CLINICAL DATA: Screening.

EXAM:
DIGITAL SCREENING BILATERAL MAMMOGRAM WITH TOMOSYNTHESIS AND CAD
TECHNIQUE: Bilateral screening digital craniocaudal and mediolateral oblique
mammograms were obtained. Bilateral screening digital breast
tomosynthesis was performed. The images were evaluated with
computer-aided detection.

[L MLO synth-2D (1 of 2)]
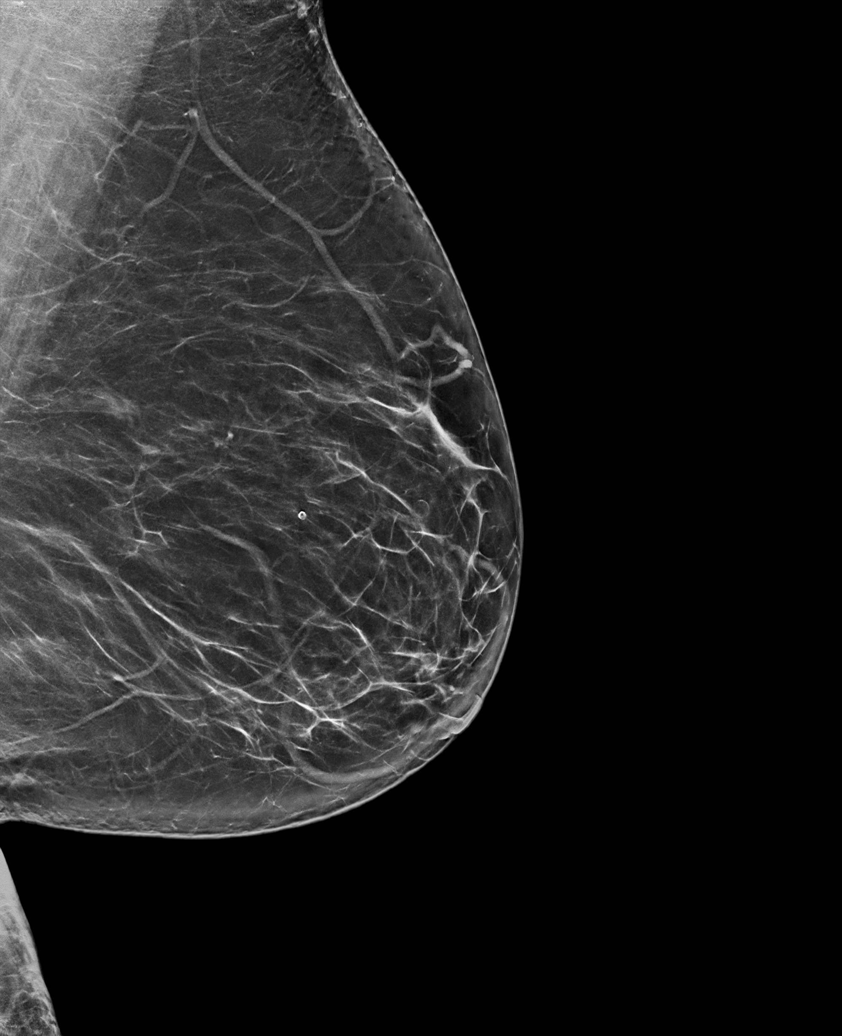

[L CC synth-2D]
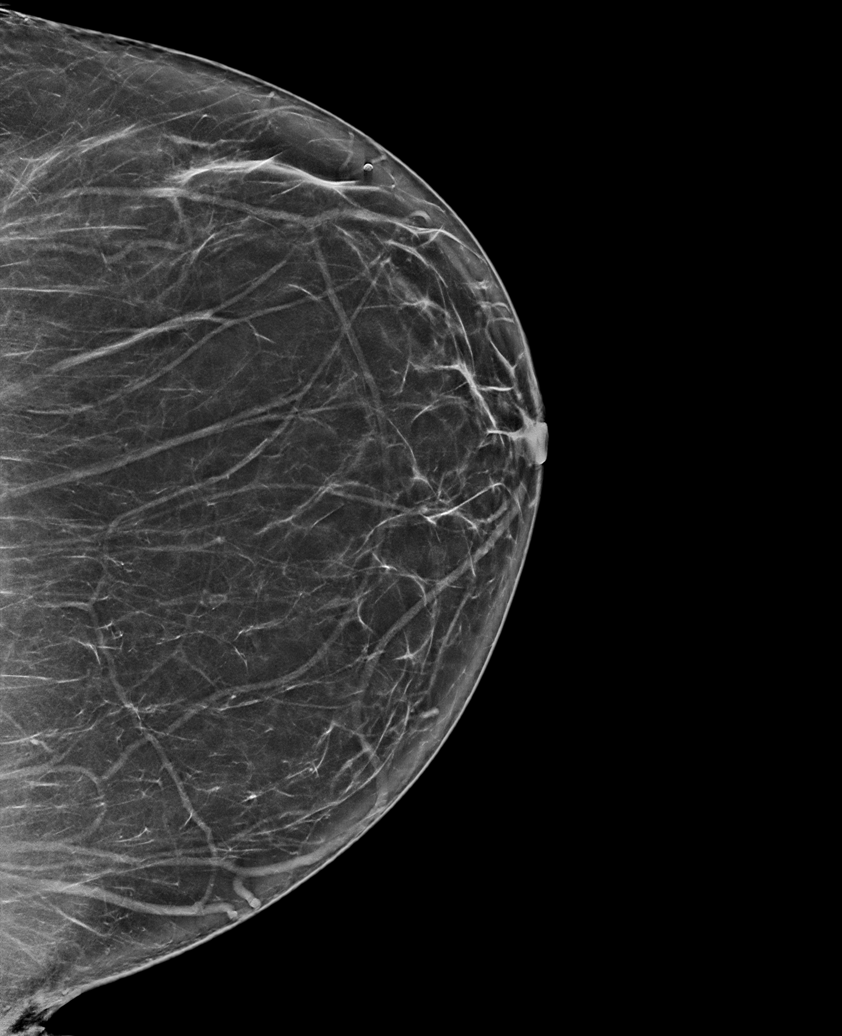

[R MLO synth-2D]
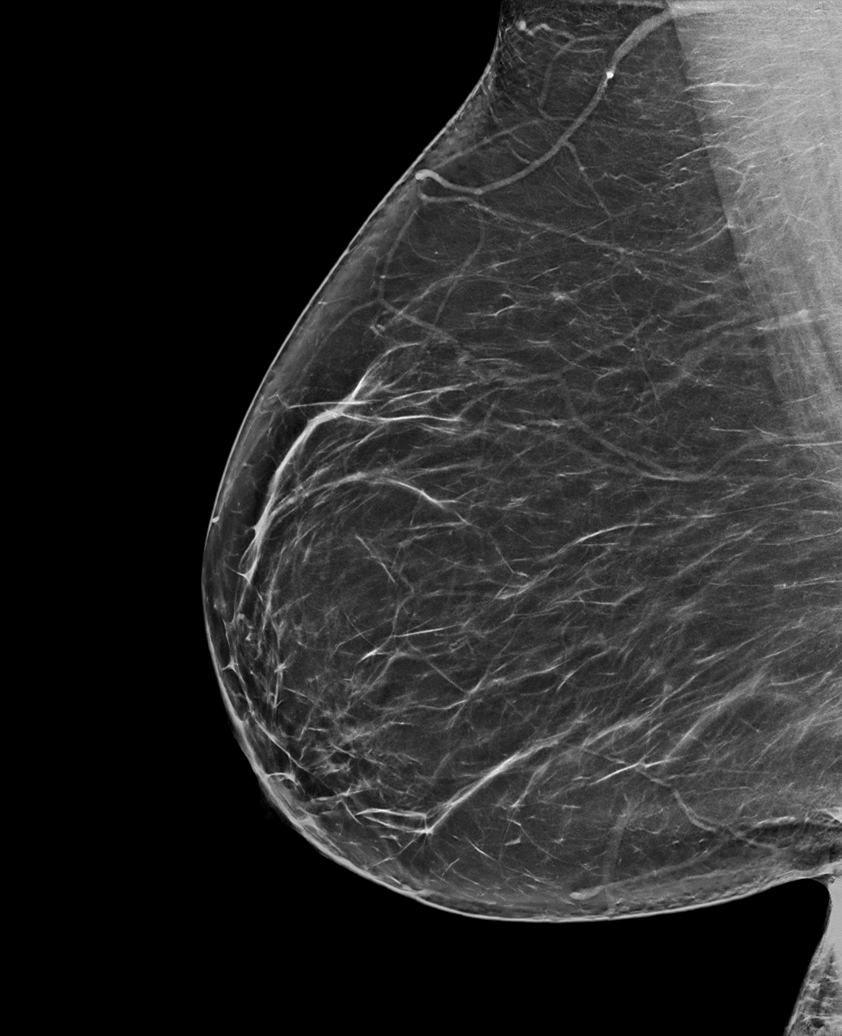

[L XCCL synth-2D]
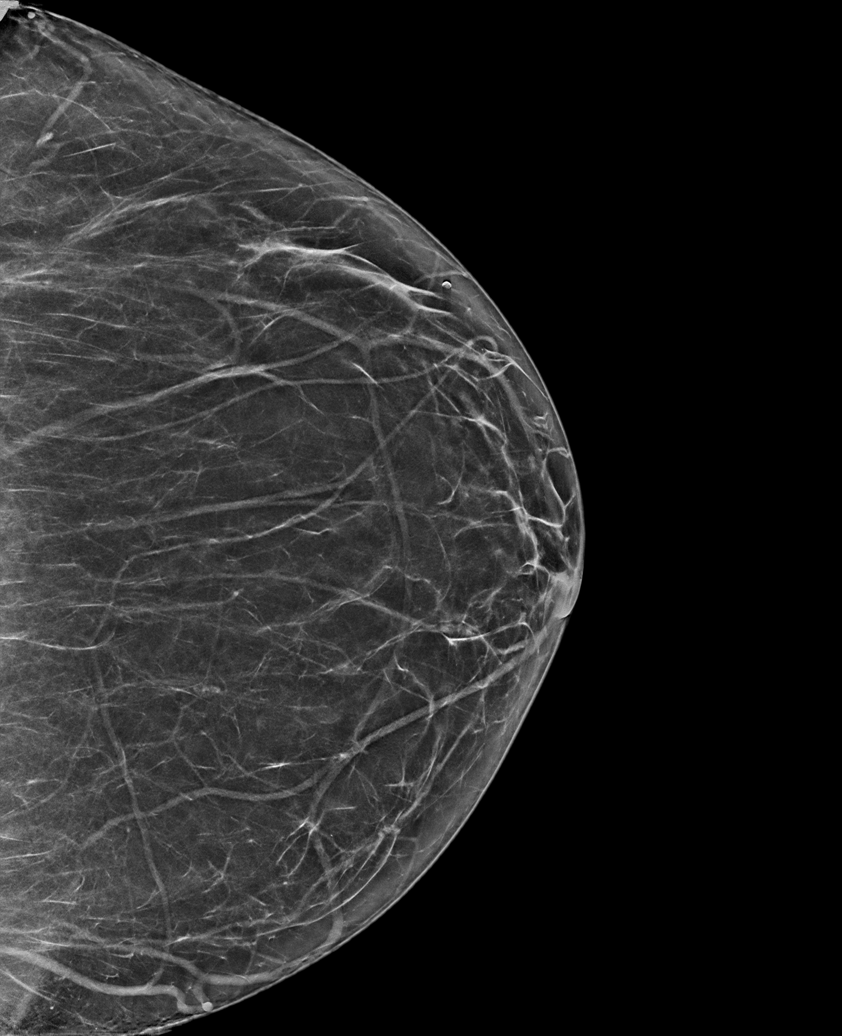

[L MLO synth-2D (2 of 2)]
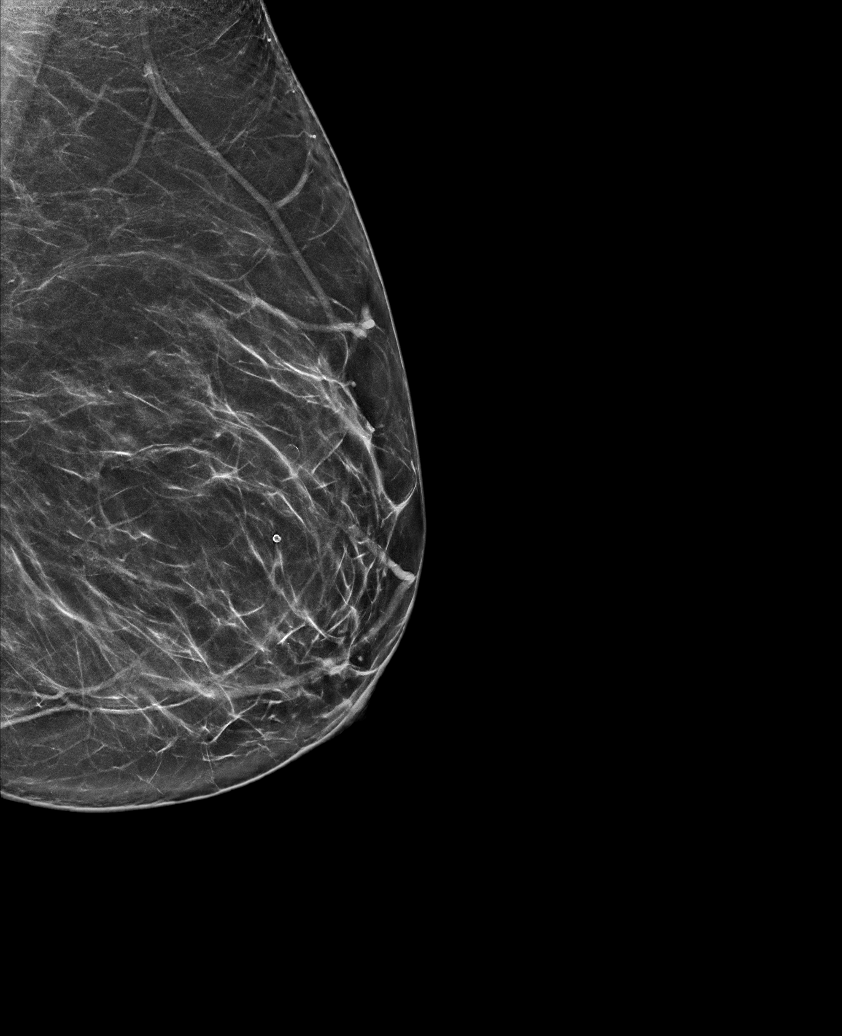

[R CC synth-2D]
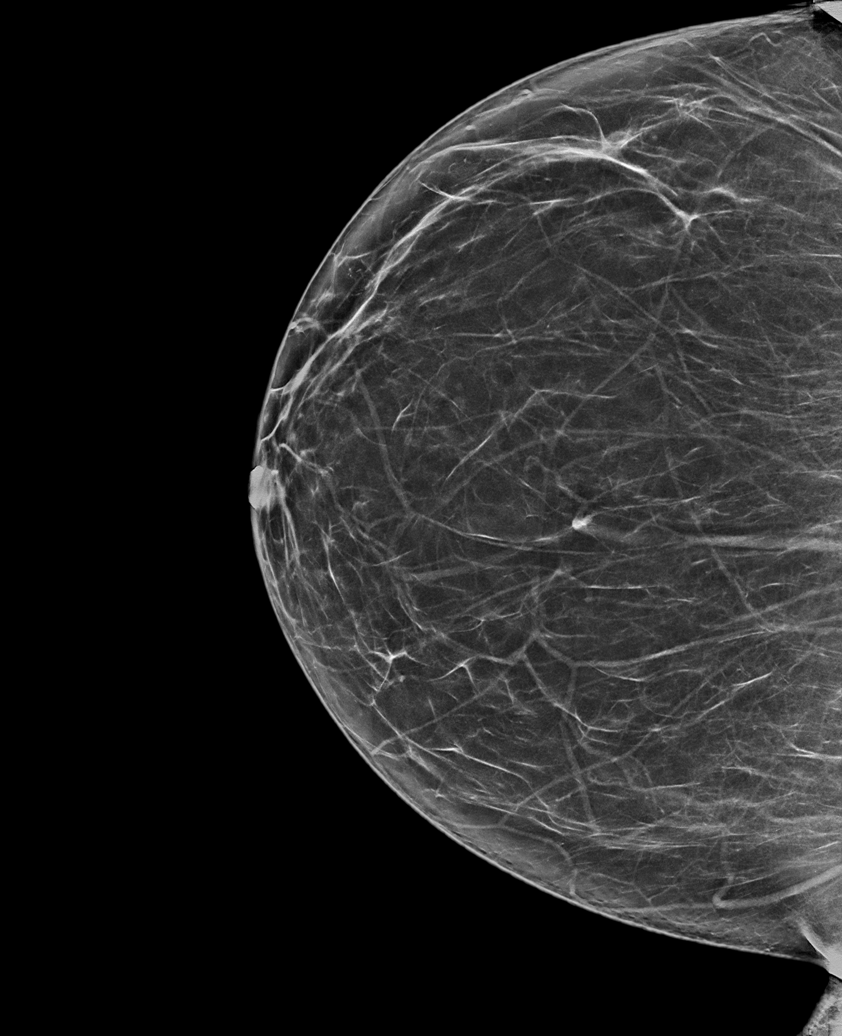

[6 of 36 positions shown; findings below may reference images not displayed]

Baseline exam.

ACR Breast Density Category b: There are scattered areas of
fibroglandular density.
FINDINGS: In the right breast, a possible asymmetry warrants further
evaluation. In the left breast, no findings suspicious for
malignancy.
IMPRESSION: Further evaluation is suggested for possible asymmetry in the right
breast.

RECOMMENDATION:
Diagnostic mammogram and possibly ultrasound of the right breast.
(Code:[id number])

The patient will be contacted regarding the findings, and additional
imaging will be scheduled.

BI-RADS CATEGORY  0: Incomplete. Need additional imaging evaluation
and/or prior mammograms for comparison.

## 2023-08-18 ENCOUNTER — Ambulatory Visit (INDEPENDENT_AMBULATORY_CARE_PROVIDER_SITE_OTHER): Payer: 59 | Admitting: Licensed Clinical Social Worker

## 2023-08-18 DIAGNOSIS — F4323 Adjustment disorder with mixed anxiety and depressed mood: Secondary | ICD-10-CM

## 2023-08-18 NOTE — Progress Notes (Signed)
THERAPIST PROGRESS NOTE  Session Time: 4:00 pm-4:45 pm  Type of Therapy: Individual Therapy  Purpose of session/Treatment Goals addressed: Lydia Wilkins will manage mood as evidenced by coping with daily and marital stressors, express emotions appropriately, identify and challenge depressive thoughts, and improve sleep for 5 out of 7 days for 60 days.  Interventions: Therapist utilized CBT,  Solution focused brief therapy,  and ACT to address mood. Therapist provided support and empathy to patient during session. Therapist administered the PHQ9 and GAD7. Therapist explored patient's feelings to identify triggers for mood. Therapist provided psychoeducation to patient on CBT and how her thoughts, feelings, and behaviors are connected.    Effectiveness: Patient was oriented x4 (person, place, situation, and time) . Patient was  Appropriate. Patient was Casually dressed. Patient noted that things have been busy. She feels disconnected from her husband. She feels like they don't communicate well. Patient feels like she is pushed to anger to get a response from him. Patient understood CBT and how her thoughts, feelings, and behaviors are related. Patient understood that her views of her relationship influence her response to her spouse.  Patient engaged in session. Patient responded well to interventions. Patient continues to meet criteria for Adjustment disorder with mixed anxiety and depressed mood  Patient will continue in outpatient therapy due to being the least restrictive service to meet her needs. Patient made minimal progress on her goals at this time.      08/18/2023    4:01 PM 06/09/2023    5:13 PM 03/18/2023   10:09 AM  Depression screen PHQ 2/9  Decreased Interest 1 1 1   Down, Depressed, Hopeless 2 2 0  PHQ - 2 Score 3 3 1   Altered sleeping 2 1 2   Tired, decreased energy 3 1 3   Change in appetite 3 1 3   Feeling bad or failure about yourself  2 0 1  Trouble concentrating 2 1 0  Moving slowly  or fidgety/restless 0 0 0  Suicidal thoughts 0 0 0  PHQ-9 Score 15 7 10   Difficult doing work/chores  Somewhat difficult Somewhat difficult       08/18/2023    4:03 PM 03/18/2023   10:09 AM 01/01/2022    4:52 PM 05/15/2020   10:53 AM  GAD 7 : Generalized Anxiety Score  Nervous, Anxious, on Edge 2 2 2 1   Control/stop worrying 1 1 1  0  Worry too much - different things 1 1 1  0  Trouble relaxing 1 2 2    Restless 2 3 3  0  Easily annoyed or irritable 3 3 3 1   Afraid - awful might happen 0 0 0 0  Total GAD 7 Score 10 12 12    Anxiety Difficulty Somewhat difficult Somewhat difficult Somewhat difficult Somewhat difficult        Suicidal/Homicidal:  No without intent/plan   Other (specify): None  Protective Factors: positive social support, responsibility to others (children, family), and hope for the future  Plan: Patient will return in 2-4 weeks.   Diagnosis: Axis I: Adjustment disorder with mixed anxiety and depressed mood    Collaboration of Care: Other Sources to be identified.   Patient/Guardian was advised Release of Information must be obtained prior to any record release in order to collaborate their care with an outside provider. Patient/Guardian was advised if they have not already done so to contact the registration department to sign all necessary forms in order for [place] to release information regarding their care.   Consent: Patient/Guardian gives verbal  consent for treatment and assignment of benefits for services provided during this visit. Patient/Guardian expressed understanding and agreed to proceed.

## 2023-09-01 ENCOUNTER — Ambulatory Visit (INDEPENDENT_AMBULATORY_CARE_PROVIDER_SITE_OTHER): Payer: 59 | Admitting: Obstetrics and Gynecology

## 2023-09-01 ENCOUNTER — Encounter: Payer: Self-pay | Admitting: Obstetrics and Gynecology

## 2023-09-01 VITALS — BP 126/81 | HR 96 | Ht 60.0 in | Wt 208.0 lb

## 2023-09-01 DIAGNOSIS — Z01419 Encounter for gynecological examination (general) (routine) without abnormal findings: Secondary | ICD-10-CM

## 2023-09-01 NOTE — Progress Notes (Deleted)
ANNUAL EXAM Patient name: Lydia Wilkins MRN [id number]  Date of birth: 01/30/1976 Chief Complaint:   Annual Exam  History of Present Illness:   Lydia Wilkins is a 47 y.o. [phone] with Patient's last menstrual period was 07/29/2023. being seen today for a routine annual exam.  Current complaints: ***   Upstream - 09/01/23 1634       Pregnancy Intention Screening   Does the patient want to become pregnant in the next year? No    Does the patient's partner want to become pregnant in the next year? No    Would the patient like to discuss contraceptive options today? No      Contraception Wrap Up   Current Method No Contraceptive Precautions    End Method No Contraception Precautions    Contraception Counseling Provided No    How was the end contraceptive method provided? N/A            The pregnancy intention screening data noted above was reviewed. Potential methods of contraception were discussed. The patient elected to proceed with No Contraception Precautions.   Last pap ***. Results were: {Pap findings:25134}. H/O abnormal pap: {yes/yes***/no:23866} Last mammogram: ***. Results were: {normal, abnormal, n/a:23837}. Family h/o breast cancer: no Last colonoscopy: ***. Results were: {normal, abnormal, n/a:23837}. Family h/o colorectal cancer: no HPV vaccine: ***     08/18/2023    4:01 PM 06/09/2023    5:13 PM 03/18/2023   10:09 AM 12/13/2022    4:29 PM 01/01/2022    4:52 PM  Depression screen PHQ 2/9  Decreased Interest   1 1 1   Down, Depressed, Hopeless   0 0 1  PHQ - 2 Score   1 1 2   Altered sleeping   2  2  Tired, decreased energy   3  3  Change in appetite   3  3  Feeling bad or failure about yourself    1  1  Trouble concentrating   0  1  Moving slowly or fidgety/restless   0  1  Suicidal thoughts   0  1  PHQ-9 Score   10  14  Difficult doing work/chores   Somewhat difficult  Somewhat difficult     Information is confidential and restricted. Go to Review  Flowsheets to unlock data.        08/18/2023    4:03 PM 03/18/2023   10:09 AM 01/01/2022    4:52 PM 05/15/2020   10:53 AM  GAD 7 : Generalized Anxiety Score  Nervous, Anxious, on Edge  2 2 1   Control/stop worrying  1 1 0  Worry too much - different things  1 1 0  Trouble relaxing  2 2   Restless  3 3 0  Easily annoyed or irritable  3 3 1   Afraid - awful might happen  0 0 0  Total GAD 7 Score  12 12   Anxiety Difficulty  Somewhat difficult Somewhat difficult Somewhat difficult     Information is confidential and restricted. Go to Review Flowsheets to unlock data.     Review of Systems:   Pertinent items are noted in HPI Denies any headaches, blurred vision, fatigue, shortness of breath, chest pain, abdominal pain, abnormal vaginal discharge/itching/odor/irritation, problems with periods, bowel movements, urination, or intercourse unless otherwise stated above. Pertinent History Reviewed:  Reviewed past medical,surgical, social and family history.  Reviewed problem list, medications and allergies. Physical Assessment:   Vitals:   09/01/23 1606  BP: 126/81  Pulse:  96  Weight: 208 lb (94.3 kg)  Height: 5' (1.524 m)  Body mass index is 40.62 kg/m.        Physical Examination:   General appearance - well appearing, and in no distress  Mental status - alert, oriented to person, place, and time  Chest - respiratory effort normal  Heart - normal peripheral perfusion  Breasts - breasts appear normal, no suspicious masses, no skin or nipple changes or axillary nodes  Abdomen - soft, nontender, nondistended, no masses or organomegaly  Pelvic - VULVA: normal appearing vulva with no masses, tenderness or lesions  VAGINA: normal appearing vagina with normal color and discharge, no lesions  CERVIX: normal appearing cervix without discharge or lesions, no CMT  Thin prep pap is {Desc; done/not:10129} *** HR HPV cotesting  UTERUS: uterus is felt to be normal size, shape, consistency and  nontender   ADNEXA: No adnexal masses or tenderness noted.  Chaperone present for exam  No results found for this or any previous visit (from the past 24 hours).  Assessment & Plan:  1) Well-Woman Exam Mammogram: {Mammo f/u:25212::"@ 47yo"}, or sooner if problems Colonoscopy: {TCS f/u:25213::"@ 47yo"}, or sooner if problems Pap: Gardasil: GC/CT: HIV/HCV:  2) ***  Labs/procedures today: ***  Orders Placed This Encounter  Procedures   HIV antibody (with reflex)   Hepatitis C Antibody    Meds: No orders of the defined types were placed in this encounter.   Follow-up: No follow-ups on file.  Lennart Pall, MD 09/01/2023 4:35 PM

## 2023-09-01 NOTE — Progress Notes (Signed)
ANNUAL EXAM Patient name: Lydia Wilkins MRN [id number]  Date of birth: October 18, 1975 Chief Complaint:   Annual Exam  History of Present Illness:   May Chheng is a 47 y.o. [phone] with Patient's last menstrual period was 07/29/2023. being seen today for a routine annual exam.  Current complaints:  Small spot of irritation on vulva worse after IC   Upstream - 09/01/23 1634       Pregnancy Intention Screening   Does the patient want to become pregnant in the next year? No    Does the patient's partner want to become pregnant in the next year? No    Would the patient like to discuss contraceptive options today? No      Contraception Wrap Up   Current Method No Contraceptive Precautions    End Method No Contraception Precautions    Contraception Counseling Provided No    How was the end contraceptive method provided? N/A            The pregnancy intention screening data noted above was reviewed. Potential methods of contraception were discussed. The patient elected to proceed with No Contraception Precautions.   Last pap 08/19/22. Results were: NILM w/ HRHPV negative. H/O abnormal pap: no Last mammogram: 04/26/22. Results were: normal. Family h/o breast cancer: no Last colonoscopy: 09/24/22. Results were: normal. Family h/o colorectal cancer: no     08/18/2023    4:01 PM 06/09/2023    5:13 PM 03/18/2023   10:09 AM 12/13/2022    4:29 PM 01/01/2022    4:52 PM  Depression screen PHQ 2/9  Decreased Interest   1 1 1   Down, Depressed, Hopeless   0 0 1  PHQ - 2 Score   1 1 2   Altered sleeping   2  2  Tired, decreased energy   3  3  Change in appetite   3  3  Feeling bad or failure about yourself    1  1  Trouble concentrating   0  1  Moving slowly or fidgety/restless   0  1  Suicidal thoughts   0  1  PHQ-9 Score   10  14  Difficult doing work/chores   Somewhat difficult  Somewhat difficult     Information is confidential and restricted. Go to Review Flowsheets to unlock data.         08/18/2023    4:03 PM 03/18/2023   10:09 AM 01/01/2022    4:52 PM 05/15/2020   10:53 AM  GAD 7 : Generalized Anxiety Score  Nervous, Anxious, on Edge  2 2 1   Control/stop worrying  1 1 0  Worry too much - different things  1 1 0  Trouble relaxing  2 2   Restless  3 3 0  Easily annoyed or irritable  3 3 1   Afraid - awful might happen  0 0 0  Total GAD 7 Score  12 12   Anxiety Difficulty  Somewhat difficult Somewhat difficult Somewhat difficult     Information is confidential and restricted. Go to Review Flowsheets to unlock data.     Review of Systems:   Pertinent items are noted in HPI Denies any headaches, blurred vision, fatigue, shortness of breath, chest pain, abdominal pain, abnormal vaginal discharge/itching/odor/irritation, problems with periods, bowel movements, urination, or intercourse unless otherwise stated above. Pertinent History Reviewed:  Reviewed past medical,surgical, social and family history.  Reviewed problem list, medications and allergies. Physical Assessment:   Vitals:   09/01/23 1606  BP: 126/81  Pulse: 96  Weight: 208 lb (94.3 kg)  Height: 5' (1.524 m)  Body mass index is 40.62 kg/m.        Physical Examination:   General appearance - well appearing, and in no distress  Mental status - alert, oriented to person, place, and time  Chest - respiratory effort normal  Heart - normal peripheral perfusion  Breasts - deferred- s/p breast exam w/ PCP this year  Abdomen - soft, nontender, nondistended, no masses or organomegaly  Pelvic - VULVA: normal appearing vulva with no masses, tenderness or lesions. Small hemorrhoid noted VAGINA: normal appearing vagina with normal color and discharge, no lesions  Chaperone present for exam  No results found for this or any previous visit (from the past 24 hours).  Assessment & Plan:  1) Well-Woman Exam Mammogram: in 1 year Colonoscopy: per GI Pap: Due 2028 Gardasil: n/a GC/CT: declines HIV/HCV:  Ordered Continue lysteda for heavy periods. Improved.   Labs/procedures today:   Orders Placed This Encounter  Procedures   HIV antibody (with reflex)   Hepatitis C Antibody    Meds: No orders of the defined types were placed in this encounter.  Follow-up: Return in about 1 year (around 08/31/2024) for annual exam or sooner as needed.  Lennart Pall, MD 09/01/2023 4:42 PM

## 2023-09-02 LAB — HIV ANTIBODY (ROUTINE TESTING W REFLEX): HIV Screen 4th Generation wRfx: NONREACTIVE

## 2023-09-02 LAB — HEPATITIS C ANTIBODY: Hep C Virus Ab: NONREACTIVE

## 2023-09-05 ENCOUNTER — Ambulatory Visit (HOSPITAL_COMMUNITY): Payer: 59 | Admitting: Licensed Clinical Social Worker

## 2023-09-05 ENCOUNTER — Encounter (HOSPITAL_COMMUNITY): Payer: Self-pay

## 2023-09-26 ENCOUNTER — Ambulatory Visit (HOSPITAL_COMMUNITY): Payer: 59 | Admitting: Licensed Clinical Social Worker

## 2023-10-17 ENCOUNTER — Ambulatory Visit (HOSPITAL_COMMUNITY): Payer: 59 | Admitting: Licensed Clinical Social Worker

## 2023-11-14 ENCOUNTER — Telehealth: Payer: Self-pay | Admitting: Medical-Surgical

## 2023-11-14 DIAGNOSIS — M79671 Pain in right foot: Secondary | ICD-10-CM

## 2023-11-14 NOTE — Telephone Encounter (Signed)
 Copied from CRM [id number]. Topic: Referral - Request for Referral >> Nov 14, 2023 11:48 AM Nila Nephew wrote: Did the patient discuss referral with their provider in the last year? Yes  Appointment offered? No  Type of order/referral and detailed reason for visit: Podiatry  Preference of office, provider, location: None  If referral order, have you been seen by this specialty before? No  Can we respond through MyChart? Yes

## 2023-11-15 NOTE — Telephone Encounter (Addendum)
 Pt requesting referral to Podiatry. States the reason was discussed with PCP.   PCP placed referral on 11/14/23.

## 2023-12-02 ENCOUNTER — Ambulatory Visit

## 2023-12-02 ENCOUNTER — Ambulatory Visit (INDEPENDENT_AMBULATORY_CARE_PROVIDER_SITE_OTHER): Admitting: Podiatry

## 2023-12-02 ENCOUNTER — Other Ambulatory Visit: Payer: Self-pay

## 2023-12-02 DIAGNOSIS — M19071 Primary osteoarthritis, right ankle and foot: Secondary | ICD-10-CM | POA: Diagnosis not present

## 2023-12-02 DIAGNOSIS — M7751 Other enthesopathy of right foot: Secondary | ICD-10-CM

## 2023-12-02 DIAGNOSIS — M7731 Calcaneal spur, right foot: Secondary | ICD-10-CM | POA: Diagnosis not present

## 2023-12-02 DIAGNOSIS — M7732 Calcaneal spur, left foot: Secondary | ICD-10-CM | POA: Diagnosis not present

## 2023-12-02 DIAGNOSIS — M722 Plantar fascial fibromatosis: Secondary | ICD-10-CM

## 2023-12-02 DIAGNOSIS — M7752 Other enthesopathy of left foot: Secondary | ICD-10-CM | POA: Diagnosis not present

## 2023-12-02 DIAGNOSIS — M19072 Primary osteoarthritis, left ankle and foot: Secondary | ICD-10-CM | POA: Diagnosis not present

## 2023-12-02 MED ORDER — MELOXICAM 15 MG PO TABS: 15.0000 mg | ORAL_TABLET | Freq: Every day | ORAL | 0 refills | Status: DC

## 2023-12-02 NOTE — Progress Notes (Signed)
  Subjective:  Patient ID: Lydia Wilkins, female    DOB: 07-13-1976,   MRN: [id number]  No chief complaint on file.   48 y.o. female presents for concern of bilateral heel pain that has been ongoing for  years and recenlty been worsening. Here with husband. Relates first steps in morning are painful. Relates she can no longer wear her heels. She does go barefoot around the house. Denies any current treatments  . Denies any other pedal complaints. Denies n/v/f/c.   Past Medical History:  Diagnosis Date   Allergy    Migraines     Objective:  Physical Exam: Vascular: DP/PT pulses 2/4 bilateral. CFT <3 seconds. Normal hair growth on digits. No edema.  Skin. No lacerations or abrasions bilateral feet.  Musculoskeletal: MMT 5/5 bilateral lower extremities in DF, PF, Inversion and Eversion. Deceased ROM in DF of ankle joint. Tender to the medial calcaneal tubercle bilaterally . No pain with achilles, PT or arch. No pain with calcaneal squeeze.  Neurological: Sensation intact to light touch.   Assessment:   1. Plantar fasciitis, bilateral      Plan:  Patient was evaluated and treated and all questions answered. Discussed plantar fasciitis with patient.  X-rays reviewed and discussed with patient. No acute fractures or dislocations noted. Mild spurring noted at inferior calcaneus. Pes planus bilateral Discussed treatment options including, ice, NSAIDS, supportive shoes, bracing, and stretching. Stretching exercises provided to be done on a daily basis.   Prescription for meloxicam provided and sent to pharmacy. Kidney function wnl.  PF brace dispensed.  Follow-up 6 weeks or sooner if any problems arise. In the meantime, encouraged to call the office with any questions, concerns, change in symptoms.      Louann Sjogren, DPM

## 2023-12-02 NOTE — Patient Instructions (Signed)

## 2024-01-10 ENCOUNTER — Ambulatory Visit: Payer: Self-pay

## 2024-01-10 NOTE — Telephone Encounter (Signed)
 Patient is scheduled to see provider on 01/12/24

## 2024-01-10 NOTE — Telephone Encounter (Signed)
 Chief Complaint: Rash, bilateral Symptoms: rash, itching, redness Frequency: x 1 week Pertinent Negatives: Patient denies fever, pain Disposition: [] ED /[] Urgent Care (no appt availability in office) / [x] Appointment(In office/virtual)/ []  [place]/ [] Home Care/ [] Refused Recommended Disposition /[] [facility]/ []  Follow-up with PCP Additional Notes: Pt reports she has had bilateral rash to hands x 1 week, notes burning after itching. Denies fever. OV scheduled. This RN educated pt on home care, new-worsening symptoms, when to call back/seek emergent care. Pt verbalized understanding and agrees to plan.     Copied from CRM [id number]. Topic: Appointments - Appointment Scheduling >> Jan 10, 2024  3:00 PM Kevelyn M wrote:  Patient is experiencing a rash mostly on the right hand but it's also on the left as well. Patient scheduled an appointment for Thursday with provider but wanted to ask a nurse for what to put on the rash for now. Reason for Disposition  Localized rash present > 7 days  Answer Assessment - Initial Assessment Questions 1. APPEARANCE of RASH: "Describe the rash."      Red 2. LOCATION: "Where is the rash located?"      Right hand primarily, some areas to left  5. ONSET: "When did the rash start?"       6. ITCHING: "Does the rash itch?" If Yes, ask: "How bad is the itch?"  (Scale 0-10; or none, mild, moderate, severe)     moderate 7. PAIN: "Does the rash hurt?" If Yes, ask: "How bad is the pain?"  (Scale 0-10; or none, mild, moderate, severe)    - NONE (0): no pain    - MILD (1-3): doesn't interfere with normal activities     - MODERATE (4-7): interferes with normal activities or awakens from sleep     - SEVERE (8-10): excruciating pain, unable to do any normal activities      8. OTHER SYMPTOMS: "Do you have any other symptoms?" (e.g., fever)  Protocols used: Rash or Redness - Localized-A-AH

## 2024-01-12 ENCOUNTER — Encounter: Payer: Self-pay | Admitting: Medical-Surgical

## 2024-01-12 ENCOUNTER — Ambulatory Visit (INDEPENDENT_AMBULATORY_CARE_PROVIDER_SITE_OTHER): Admitting: Medical-Surgical

## 2024-01-12 VITALS — BP 110/72 | HR 73 | Resp 20 | Ht 60.0 in | Wt 207.6 lb

## 2024-01-12 DIAGNOSIS — L239 Allergic contact dermatitis, unspecified cause: Secondary | ICD-10-CM

## 2024-01-12 MED ORDER — TRIAMCINOLONE ACETONIDE 0.1 % EX CREA: 1.0000 | TOPICAL_CREAM | Freq: Two times a day (BID) | CUTANEOUS | 1 refills | Status: AC

## 2024-01-12 NOTE — Progress Notes (Signed)
        Established patient visit  History, exam, impression, and plan:  1. Allergic contact dermatitis, unspecified trigger (Primary) Pleasant 48 year old female presenting today with reports of a rash on her hands.  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  She does have gloves but notes that they have recently changed to a different brand.  These are latex gloves and she has previously done fine wearing them.  That afternoon, she noted that her hands were red.  Throughout the night and the next day, the dorsal hands became very erythematous and swollen.  Her fingers were also swollen and it was painful to try to flex her fingers to make a fist.  The rash was worse on the right hand but also affected the left hand.  She has been using hydrocortisone  on the area and notes that it has greatly improved.  Today, the swelling has fully resolved and it is no longer erythematous.  She does have a papular flesh tone rash palpable on the dorsal hand between the 1st and 2nd metacarpal extending down to the wrist on both hands.  It is still mildly itchy however she notes that most of the itching has resolved.  Suspect that the new latex gloves plus the chemicals used and washing the dishes and the heat may have created a chemical reaction causing contact dermatitis.  It is greatly improved however hydrocortisone  has not resolved the rash itself.  Switching to triamcinolone cream twice daily for up to 14 days as needed.  During the day, okay to use Aquaphor or Eucerin to help maintain moisture to the area.  Procedures performed this visit: None.  Return if symptoms worsen or fail to improve.  __________________________________ Maryl Snook, DNP, APRN, FNP-BC Primary Care and Sports Medicine [facility] [employer]

## 2024-01-13 ENCOUNTER — Ambulatory Visit (INDEPENDENT_AMBULATORY_CARE_PROVIDER_SITE_OTHER): Admitting: Podiatry

## 2024-01-13 ENCOUNTER — Encounter: Payer: Self-pay | Admitting: Podiatry

## 2024-01-13 DIAGNOSIS — M722 Plantar fascial fibromatosis: Secondary | ICD-10-CM | POA: Diagnosis not present

## 2024-01-13 MED ORDER — MELOXICAM 15 MG PO TABS: 15.0000 mg | ORAL_TABLET | Freq: Every day | ORAL | 0 refills | Status: AC

## 2024-01-13 NOTE — Progress Notes (Signed)
  Subjective:  Patient ID: Lydia Wilkins, female    DOB: Jan 25, 1976,   MRN: [id number]  Chief Complaint  Patient presents with   Plantar Fasciitis    Pt presents for a follow up of pf both feet states the right foot is doing so much better than the left but overall improved.    48 y.o. female presents for follow-up of bilateral plantar fasciitis. Relates doing about 60% better. She has been stretching, using braces and taking meloxicam  which has helped and is happy with her progress. Denies any other pedal complaints. Denies n/v/f/c.   Past Medical History:  Diagnosis Date   Allergy    Migraines     Objective:  Physical Exam: Vascular: DP/PT pulses 2/4 bilateral. CFT <3 seconds. Normal hair growth on digits. No edema.  Skin. No lacerations or abrasions bilateral feet.  Musculoskeletal: MMT 5/5 bilateral lower extremities in DF, PF, Inversion and Eversion. Deceased ROM in DF of ankle joint. Tender to the medial calcaneal tubercle bilaterally minimally  . No pain with achilles, PT or arch. No pain with calcaneal squeeze.  Neurological: Sensation intact to light touch.   Assessment:   1. Plantar fasciitis, bilateral       Plan:  Patient was evaluated and treated and all questions answered. Discussed plantar fasciitis with patient.  X-rays reviewed and discussed with patient. No acute fractures or dislocations noted. Mild spurring noted at inferior calcaneus. Pes planus bilateral Discussed treatment options including, ice, NSAIDS, supportive shoes, bracing, and stretching.  Continue stretching  Continue brace  Contine meloxicam . Refill sent.  Follow-up as needed    Jennefer Moats, DPM

## 2024-03-23 ENCOUNTER — Telehealth: Payer: Self-pay

## 2024-03-23 DIAGNOSIS — Z1231 Encounter for screening mammogram for malignant neoplasm of breast: Secondary | ICD-10-CM

## 2024-03-23 NOTE — Telephone Encounter (Signed)
 Copied from CRM [id number]. Topic: General - Other >> Mar 23, 2024 12:01 PM Miquel SAILOR wrote: Reason for CRM: Patient received alert for MAM for the year but no order in the system. Need back when updated .  [phone]

## 2024-03-23 NOTE — Telephone Encounter (Signed)
Pended Mammogram.

## 2024-03-28 ENCOUNTER — Ambulatory Visit: Payer: Self-pay

## 2024-03-28 NOTE — Telephone Encounter (Signed)
 FYI Only or Action Required?: FYI only for provider.  Patient was last seen in primary care on 01/12/2024 by Willo Mini, NP.  Called Nurse Triage reporting Otalgia.  Symptoms began several days ago.  Interventions attempted: OTC medications: ibuprofen and Prescription medications: amoxicillin .  Symptoms are: gradually worsening.  Triage Disposition: See Within 3 Days in Office (overriding See Physician Within 24 Hours)  Patient/caregiver understands and will follow disposition?: Yes                             Copied from CRM #[id number]. Topic: Clinical - Red Word Triage >> Mar 28, 2024 10:20 AM Lydia Wilkins wrote: Red Word that prompted transfer to Nurse Triage: Ear pain , swollen, muffled hearing, amoxicillin  hasn't helped Reason for Disposition  Earache  (Exceptions: Brief ear pain of lasting less than 60 minutes, or earache occurring during air travel.)  Answer Assessment - Initial Assessment Questions 1. LOCATION: Which ear is involved?     Mainly right ear, left ear is starting to feel muffled 2. ONSET: When did the ear pain start?      Sunday/Monday 3. SEVERITY: How bad is the pain?  (Scale 1-10; mild, moderate or severe)     Rates pain a 9 at this time 4. URI SYMPTOMS: Do you have a runny nose or cough?     Denies 5. FEVER: Do you have a fever? If Yes, ask: What is your temperature, how was it measured, and when did it start?     Denies 6. CAUSE: Have you been swimming recently?, How often do you use Q-TIPS?, Have you had any recent air travel or scuba diving?     States she went swimming on Saturday  7. OTHER SYMPTOMS: Do you have any other symptoms? (e.g., decreased hearing, dizziness, headache, stiff neck, vomiting)     Swelling, headaches at baseline, denies dizziness    Ibuprofen and amoxicillin  (states she had this at home). No availability in office today. Scheduled for first available appointment  (tomorrow).  Protocols used: [place]

## 2024-03-29 ENCOUNTER — Encounter: Payer: Self-pay | Admitting: Family Medicine

## 2024-03-29 ENCOUNTER — Ambulatory Visit (INDEPENDENT_AMBULATORY_CARE_PROVIDER_SITE_OTHER): Admitting: Family Medicine

## 2024-03-29 ENCOUNTER — Ambulatory Visit: Admitting: Medical-Surgical

## 2024-03-29 VITALS — BP 129/85 | HR 63 | Ht 60.0 in | Wt 218.0 lb

## 2024-03-29 DIAGNOSIS — H60331 Swimmer's ear, right ear: Secondary | ICD-10-CM | POA: Diagnosis not present

## 2024-03-29 DIAGNOSIS — H6090 Unspecified otitis externa, unspecified ear: Secondary | ICD-10-CM | POA: Insufficient documentation

## 2024-03-29 MED ORDER — CIPROFLOXACIN-DEXAMETHASONE 0.3-0.1 % OT SUSP: 4.0000 [drp] | Freq: Two times a day (BID) | OTIC | 0 refills | Status: AC

## 2024-03-29 NOTE — Assessment & Plan Note (Signed)
 Treating with ciprodex .  Avoid swimming or prolonged water exposure until improved.  Red flags reviewed.

## 2024-03-29 NOTE — Patient Instructions (Signed)
Otitis Externa  Otitis externa is an infection of the outer ear canal. The outer ear canal is the area between the outside of the ear and the eardrum. Otitis externa is sometimes called swimmer's ear. What are the causes? Common causes of this condition include: Swimming in dirty water. Moisture in the ear. An injury to the inside of the ear. An object stuck in the ear. A cut or scrape on the outside of the ear or in the ear canal. What increases the risk? You are more likely to develop this condition if you go swimming often. What are the signs or symptoms? The first symptom of this condition is often itching in the ear. Later symptoms of the condition include: Swelling of the ear. Redness in the ear. Ear pain. The pain may get worse when you pull on your ear. Pus coming from the ear. How is this diagnosed? This condition may be diagnosed by examining the ear and testing fluid from the ear for bacteria and funguses. How is this treated? This condition may be treated with: Antibiotic ear drops. These are often given for 10-14 days. Medicines to reduce itching and swelling. Follow these instructions at home: If you were prescribed antibiotic ear drops, use them as told by your health care provider. Do not stop using the antibiotic even if you start to feel better. Take over-the-counter and prescription medicines only as told by your health care provider. Avoid getting water in your ears as told by your health care provider. This may include avoiding swimming or water sports for a few days. Keep all follow-up visits. This is important. How is this prevented? Keep your ears dry. Use the corner of a towel to dry your ears after you swim or bathe. Avoid scratching or putting things in your ear. Doing these things can damage the ear canal or remove the protective wax that lines it, which makes it easier for bacteria and funguses to grow. Avoid swimming in lakes, polluted water, or swimming  pools that may not have enough chlorine. Contact a health care provider if: You have a fever. Your ear is still red, swollen, painful, or draining pus after 3 days. Your redness, swelling, or pain gets worse. You have a severe headache. Get help right away if: You have redness, swelling, and pain or tenderness in the area behind your ear. Summary Otitis externa is an infection of the outer ear canal. Common causes include swimming in dirty water, moisture in the ear, or a cut or scrape in the ear. Symptoms include pain, redness, and swelling of the ear canal. If you were prescribed antibiotic ear drops, use them as told by your health care provider. Do not stop using the antibiotic even if you start to feel better. This information is not intended to replace advice given to you by your health care provider. Make sure you discuss any questions you have with your health care provider. Document Revised: 11/05/2020 Document Reviewed: 11/05/2020 Elsevier Patient Education  2024 [employer]  

## 2024-03-29 NOTE — Progress Notes (Signed)
 Lydia Wilkins - 48 y.o. female MRN [id number]  Date of birth: 10-Sep-1975  Subjective Chief Complaint  Patient presents with   Ear Pain    HPI Lydia Wilkins is a 48 y.o. female here today with complaint of ear pain.  Pain is in the R ear with some pain in the L ear.  She does have swelling of the ear.  Has not noted drainage and denies fever.  She has not had headache.  She did go swimming Saturday and symptoms started the following day.    ROS:  A comprehensive ROS was completed and negative except as noted per HPI  No Known Allergies  Past Medical History:  Diagnosis Date   Allergy    Migraines     Past Surgical History:  Procedure Laterality Date   COLONOSCOPY  09/24/2022   NO PAST SURGERIES      Social History   Socioeconomic History   Marital status: Married    Spouse name: Not on file   Number of children: Not on file   Years of education: Not on file   Highest education level: Associate degree: occupational, Scientist, product/process development, or vocational program  Occupational History   Not on file  Tobacco Use   Smoking status: Never   Smokeless tobacco: Never  Vaping Use   Vaping status: Never Used  Substance and Sexual Activity   Alcohol use: Not Currently   Drug use: Never   Sexual activity: Yes    Partners: Male    Birth control/protection: None  Other Topics Concern   Not on file  Social History Narrative   Not on file   Social Drivers of Health   Financial Resource Strain: Medium Risk (11/30/2022)   Overall Financial Resource Strain (CARDIA)    Difficulty of Paying Living Expenses: Somewhat hard  Food Insecurity: Food Insecurity Present (11/30/2022)   Hunger Vital Sign    Worried About Running Out of Food in the Last Year: Sometimes true    Ran Out of Food in the Last Year: Sometimes true  Transportation Needs: No Transportation Needs (11/30/2022)   PRAPARE - Administrator, Civil Service (Medical): No    Lack of Transportation (Non-Medical): No  Physical  Activity: Inactive (11/30/2022)   Exercise Vital Sign    Days of Exercise per Week: 1 day    Minutes of Exercise per Session: 0 min  Stress: Stress Concern Present (11/30/2022)   [employer] of Occupational Health - Occupational Stress Questionnaire    Feeling of Stress : To some extent  Social Connections: Moderately Integrated (11/30/2022)   Social Connection and Isolation Panel    Frequency of Communication with Friends and Family: Once a week    Frequency of Social Gatherings with Friends and Family: Never    Attends Religious Services: 1 to 4 times per year    Active Member of [employer] or Organizations: Yes    Attends Banker Meetings: 1 to 4 times per year    Marital Status: Married    Family History  Problem Relation Age of Onset   Diabetes Father    Diabetes Paternal Grandmother    Heart disease Paternal Grandmother    Stroke Paternal Grandmother    Colon cancer Neg Hx    Colon polyps Neg Hx    Esophageal cancer Neg Hx    Rectal cancer Neg Hx    Stomach cancer Neg Hx     Health Maintenance  Topic Date Due   Hepatitis B Vaccines (  1 of 3 - 19+ 3-dose series) Never done   COVID-19 Vaccine (1 - 2024-25 season) Never done   INFLUENZA VACCINE  04/06/2024   Cervical Cancer Screening (HPV/Pap Cotest)  08/20/2027   DTaP/Tdap/Td (2 - Td or Tdap) 05/15/2030   Colonoscopy  09/24/2032   Hepatitis C Screening  Completed   HIV Screening  Completed   HPV VACCINES  Aged Out   Meningococcal B Vaccine  Aged Out     ----------------------------------------------------------------------------------------------------------------------------------------------------------------------------------------------------------------- Physical Exam BP 129/85 (BP Location: Left Arm, Patient Position: Sitting, Cuff Size: Normal)   Pulse 63   Ht 5' (1.524 m)   Wt 218 lb (98.9 kg)   SpO2 99%   BMI 42.58 kg/m   Physical Exam Constitutional:      Appearance: Normal appearance.   Eyes:     General: No scleral icterus. Cardiovascular:     Rate and Rhythm: Normal rate and regular rhythm.  Pulmonary:     Effort: Pulmonary effort is normal.     Breath sounds: Normal breath sounds.  Neurological:     Mental Status: She is alert.  Psychiatric:        Mood and Affect: Mood normal.        Behavior: Behavior normal.     ------------------------------------------------------------------------------------------------------------------------------------------------------------------------------------------------------------------- Assessment and Plan  Otitis externa Treating with ciprodex .  Avoid swimming or prolonged water exposure until improved.  Red flags reviewed.    Meds ordered this encounter  Medications   ciprofloxacin -dexamethasone  (CIPRODEX ) OTIC suspension    Sig: Place 4 drops into both ears 2 (two) times daily for 7 days.    Dispense:  7.5 mL    Refill:  0    No follow-ups on file.

## 2024-05-08 ENCOUNTER — Encounter: Payer: Self-pay | Admitting: Sports Medicine

## 2024-05-10 ENCOUNTER — Ambulatory Visit

## 2024-06-21 ENCOUNTER — Ambulatory Visit

## 2024-06-21 DIAGNOSIS — Z1231 Encounter for screening mammogram for malignant neoplasm of breast: Secondary | ICD-10-CM | POA: Diagnosis not present

## 2024-06-26 ENCOUNTER — Ambulatory Visit: Payer: Self-pay | Admitting: Medical-Surgical
